# Patient Record
Sex: Male | Born: 1970 | Race: Black or African American | Hispanic: No | Marital: Married | Smoking: Never smoker
Health system: Southern US, Community
[De-identification: ages and names within clinical notes are randomized; demographics above are authoritative.]

## PROBLEM LIST (undated history)

## (undated) DIAGNOSIS — N400 Enlarged prostate without lower urinary tract symptoms: Secondary | ICD-10-CM

## (undated) DIAGNOSIS — K219 Gastro-esophageal reflux disease without esophagitis: Secondary | ICD-10-CM

## (undated) DIAGNOSIS — G43909 Migraine, unspecified, not intractable, without status migrainosus: Secondary | ICD-10-CM

## (undated) HISTORY — PX: VASECTOMY: SHX75

## (undated) HISTORY — PX: FINGER FRACTURE SURGERY: SHX638

## (undated) HISTORY — PX: OTHER SURGICAL HISTORY: SHX169

---

## 1999-10-16 ENCOUNTER — Emergency Department (HOSPITAL_COMMUNITY): Admission: EM | Admit: 1999-10-16 | Discharge: 1999-10-16 | Payer: Self-pay | Admitting: Emergency Medicine

## 2000-07-01 ENCOUNTER — Emergency Department (HOSPITAL_COMMUNITY): Admission: EM | Admit: 2000-07-01 | Discharge: 2000-07-01 | Payer: Self-pay | Admitting: *Deleted

## 2001-07-18 ENCOUNTER — Encounter: Payer: Self-pay | Admitting: Neurosurgery

## 2001-07-18 ENCOUNTER — Ambulatory Visit (HOSPITAL_COMMUNITY): Admission: RE | Admit: 2001-07-18 | Discharge: 2001-07-18 | Payer: Self-pay | Admitting: Neurosurgery

## 2001-08-03 ENCOUNTER — Encounter: Admission: RE | Admit: 2001-08-03 | Discharge: 2001-08-08 | Payer: Self-pay | Admitting: Anesthesiology

## 2019-04-16 DIAGNOSIS — R319 Hematuria, unspecified: Secondary | ICD-10-CM | POA: Diagnosis not present

## 2019-04-16 DIAGNOSIS — N419 Inflammatory disease of prostate, unspecified: Secondary | ICD-10-CM | POA: Diagnosis not present

## 2019-04-16 DIAGNOSIS — N390 Urinary tract infection, site not specified: Secondary | ICD-10-CM | POA: Diagnosis not present

## 2019-06-15 DIAGNOSIS — M549 Dorsalgia, unspecified: Secondary | ICD-10-CM | POA: Diagnosis not present

## 2019-08-23 DIAGNOSIS — L218 Other seborrheic dermatitis: Secondary | ICD-10-CM | POA: Diagnosis not present

## 2019-08-23 DIAGNOSIS — L209 Atopic dermatitis, unspecified: Secondary | ICD-10-CM | POA: Diagnosis not present

## 2019-08-23 DIAGNOSIS — B360 Pityriasis versicolor: Secondary | ICD-10-CM | POA: Diagnosis not present

## 2019-11-06 DIAGNOSIS — Z20828 Contact with and (suspected) exposure to other viral communicable diseases: Secondary | ICD-10-CM | POA: Diagnosis not present

## 2019-12-13 ENCOUNTER — Encounter: Payer: Self-pay | Admitting: Sports Medicine

## 2019-12-13 ENCOUNTER — Ambulatory Visit (INDEPENDENT_AMBULATORY_CARE_PROVIDER_SITE_OTHER): Payer: 59 | Admitting: Sports Medicine

## 2019-12-13 DIAGNOSIS — Z0000 Encounter for general adult medical examination without abnormal findings: Secondary | ICD-10-CM | POA: Diagnosis not present

## 2019-12-13 DIAGNOSIS — Z8619 Personal history of other infectious and parasitic diseases: Secondary | ICD-10-CM | POA: Insufficient documentation

## 2019-12-13 DIAGNOSIS — R300 Dysuria: Secondary | ICD-10-CM | POA: Insufficient documentation

## 2019-12-13 LAB — POCT URINALYSIS DIP (CLINITEK)
Bilirubin, UA: NEGATIVE
Blood, UA: NEGATIVE
Glucose, UA: NEGATIVE mg/dL
Ketones, POC UA: NEGATIVE mg/dL
Leukocytes, UA: NEGATIVE
Nitrite, UA: NEGATIVE
POC PROTEIN,UA: NEGATIVE
Spec Grav, UA: 1.015 (ref 1.010–1.025)
Urobilinogen, UA: 0.2 E.U./dL
pH, UA: 6.5 (ref 5.0–8.0)

## 2019-12-13 MED ORDER — TADALAFIL 20 MG PO TABS: 20.0000 mg | ORAL_TABLET | Freq: Every day | ORAL | 11 refills | Status: DC

## 2019-12-13 MED ORDER — CIPROFLOXACIN HCL 750 MG PO TABS: 750.0000 mg | ORAL_TABLET | Freq: Two times a day (BID) | ORAL | 0 refills | Status: AC

## 2019-12-13 MED ORDER — TADALAFIL 5 MG PO TABS: 5.0000 mg | ORAL_TABLET | Freq: Every day | ORAL | 11 refills | Status: DC

## 2019-12-13 NOTE — Assessment & Plan Note (Addendum)
Likely due to the above but we are doing a full STD screen.

## 2019-12-13 NOTE — Assessment & Plan Note (Signed)
Adding routine labs 

## 2019-12-13 NOTE — Assessment & Plan Note (Signed)
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 Slight burning, hesitancy. Because of his history of severe prostatitis with urosepsis we are going to go ahead and start Cipro 750 twice a day for 10 days. Adding Cialis 5 mg daily with a good Rx coupon to [employer] to keep it affordable. Routine labs including blood cultures and PSA. Urinalysis was negative today.

## 2019-12-13 NOTE — Progress Notes (Signed)
    Procedures performed today:    Urinalysis was completely negative.  Independent interpretation of tests performed by another provider:   None.  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 Slight burning, hesitancy. Because of his history of severe prostatitis with urosepsis we are going to go ahead and start Cipro 750 twice a day for 10 days. Adding Cialis 5 mg daily with a good Rx coupon to [employer] to keep it affordable. Routine labs including blood cultures and PSA. Urinalysis was negative today.  Dysuria Likely due to the above but we are doing a full STD screen.  Annual physical exam Adding routine labs.    ___________________________________________ Kent Scott. Benjamin Stain, M.D., ABFM., CAQSM. Primary Care and Sports Medicine [facility]  Adjunct Instructor of Family Medicine  [employer]

## 2019-12-14 LAB — C. TRACHOMATIS/N. GONORRHOEAE RNA
C. trachomatis RNA, TMA: NOT DETECTED
N. gonorrhoeae RNA, TMA: NOT DETECTED

## 2019-12-17 DIAGNOSIS — Z20828 Contact with and (suspected) exposure to other viral communicable diseases: Secondary | ICD-10-CM | POA: Diagnosis not present

## 2019-12-20 LAB — TESTOSTERONE, FREE & TOTAL
Free Testosterone: 53.9 pg/mL (ref 35.0–155.0)
Testosterone, Total, LC-MS-MS: 432 ng/dL (ref 250–1100)

## 2019-12-20 LAB — CBC WITH DIFFERENTIAL/PLATELET
Absolute Monocytes: 440 cells/uL (ref 200–950)
Basophils Absolute: 30 cells/uL (ref 0–200)
Basophils Relative: 0.8 %
Eosinophils Absolute: 81 cells/uL (ref 15–500)
Eosinophils Relative: 2.2 %
HCT: 43.9 % (ref 38.5–50.0)
Hemoglobin: 14.4 g/dL (ref 13.2–17.1)
Lymphs Abs: 1939 cells/uL (ref 850–3900)
MCH: 27.4 pg (ref 27.0–33.0)
MCHC: 32.8 g/dL (ref 32.0–36.0)
MCV: 83.6 fL (ref 80.0–100.0)
MPV: 10.6 fL (ref 7.5–12.5)
Monocytes Relative: 11.9 %
Neutro Abs: 1210 cells/uL — ABNORMAL LOW (ref 1500–7800)
Neutrophils Relative %: 32.7 %
Platelets: 232 10*3/uL (ref 140–400)
RBC: 5.25 10*6/uL (ref 4.20–5.80)
RDW: 12.9 % (ref 11.0–15.0)
Total Lymphocyte: 52.4 %
WBC: 3.7 10*3/uL — ABNORMAL LOW (ref 3.8–10.8)

## 2019-12-20 LAB — CULTURE, BLOOD (SINGLE): MICRO NUMBER:: 10004298

## 2019-12-20 LAB — COMPLETE METABOLIC PANEL WITH GFR
AG Ratio: 1.3 (calc) (ref 1.0–2.5)
ALT: 30 U/L (ref 9–46)
AST: 23 U/L (ref 10–40)
Albumin: 4.5 g/dL (ref 3.6–5.1)
Alkaline phosphatase (APISO): 58 U/L (ref 36–130)
BUN: 9 mg/dL (ref 7–25)
CO2: 26 mmol/L (ref 20–32)
Calcium: 9.9 mg/dL (ref 8.6–10.3)
Chloride: 101 mmol/L (ref 98–110)
Creat: 1.19 mg/dL (ref 0.60–1.35)
GFR, Est African American: 84 mL/min/{1.73_m2} (ref >=60)
GFR, Est Non African American: 73 mL/min/{1.73_m2} (ref >=60)
Globulin: 3.4 g/dL (calc) (ref 1.9–3.7)
Glucose, Bld: 93 mg/dL (ref 65–99)
Potassium: 4.1 mmol/L (ref 3.5–5.3)
Sodium: 137 mmol/L (ref 135–146)
Total Bilirubin: 0.5 mg/dL (ref 0.2–1.2)
Total Protein: 7.9 g/dL (ref 6.1–8.1)

## 2019-12-20 LAB — LIPID PANEL W/REFLEX DIRECT LDL
Cholesterol: 185 mg/dL (ref <200)
HDL: 44 mg/dL (ref >=40)
LDL Cholesterol (Calc): 119 mg/dL (calc) — ABNORMAL HIGH
Non-HDL Cholesterol (Calc): 141 mg/dL (calc) — ABNORMAL HIGH (ref <130)
Total CHOL/HDL Ratio: 4.2 (calc) (ref <5.0)
Triglycerides: 108 mg/dL (ref <150)

## 2019-12-20 LAB — HEPATITIS PANEL, ACUTE
Hep A IgM: NONREACTIVE
Hep B C IgM: NONREACTIVE
Hepatitis B Surface Ag: NONREACTIVE
Hepatitis C Ab: NONREACTIVE
SIGNAL TO CUT-OFF: 0.05 (ref <1.00)

## 2019-12-20 LAB — TSH: TSH: 2.66 mIU/L (ref 0.40–4.50)

## 2019-12-20 LAB — HEMOGLOBIN A1C
Hgb A1c MFr Bld: 6 % of total Hgb — ABNORMAL HIGH (ref <5.7)
Mean Plasma Glucose: 126 (calc)
eAG (mmol/L): 7 (calc)

## 2019-12-20 LAB — HIV ANTIBODY (ROUTINE TESTING W REFLEX): HIV 1&2 Ab, 4th Generation: NONREACTIVE

## 2019-12-20 LAB — HSV 2 ANTIBODY, IGG: HSV 2 Glycoprotein G Ab, IgG: 0.94 index — ABNORMAL HIGH

## 2019-12-20 LAB — PSA, TOTAL AND FREE
PSA, % Free: 29 % (calc) (ref >25)
PSA, Free: 0.2 ng/mL
PSA, Total: 0.7 ng/mL (ref <=4.0)

## 2019-12-20 LAB — RPR: RPR Ser Ql: NONREACTIVE

## 2019-12-20 LAB — VITAMIN D 25 HYDROXY (VIT D DEFICIENCY, FRACTURES): Vit D, 25-Hydroxy: 47 ng/mL (ref 30–100)

## 2020-01-10 ENCOUNTER — Other Ambulatory Visit: Payer: Self-pay

## 2020-01-10 ENCOUNTER — Encounter: Payer: Self-pay | Admitting: Sports Medicine

## 2020-01-10 ENCOUNTER — Ambulatory Visit (INDEPENDENT_AMBULATORY_CARE_PROVIDER_SITE_OTHER): Payer: 59 | Admitting: Sports Medicine

## 2020-01-10 DIAGNOSIS — N400 Enlarged prostate without lower urinary tract symptoms: Secondary | ICD-10-CM

## 2020-01-10 DIAGNOSIS — R7303 Prediabetes: Secondary | ICD-10-CM | POA: Insufficient documentation

## 2020-01-10 NOTE — Assessment & Plan Note (Signed)
We talked in detail about his prediabetes, he will work aggressively on weight loss.

## 2020-01-10 NOTE — Progress Notes (Signed)
    Procedures performed today:    None.  Independent interpretation of tests performed by another provider:   None.  Impression and Recommendations:    BPH (benign prostatic hyperplasia) Fantastic response to Cialis, no further episodes of nocturia, erectile function is also adequate.  Prediabetes We talked in detail about his prediabetes, he will work aggressively on weight loss.    ___________________________________________ Ihor Austin. Benjamin Stain, M.D., ABFM., CAQSM. Primary Care and Sports Medicine [facility]  Adjunct Instructor of Family Medicine  [employer]

## 2020-01-10 NOTE — Assessment & Plan Note (Signed)
Fantastic response to Cialis, no further episodes of nocturia, erectile function is also adequate.

## 2020-02-01 ENCOUNTER — Ambulatory Visit (INDEPENDENT_AMBULATORY_CARE_PROVIDER_SITE_OTHER): Payer: 59 | Admitting: Sports Medicine

## 2020-02-01 ENCOUNTER — Encounter: Payer: Self-pay | Admitting: Sports Medicine

## 2020-02-01 ENCOUNTER — Ambulatory Visit (INDEPENDENT_AMBULATORY_CARE_PROVIDER_SITE_OTHER): Payer: 59

## 2020-02-01 ENCOUNTER — Other Ambulatory Visit: Payer: Self-pay

## 2020-02-01 DIAGNOSIS — Z779 Other contact with and (suspected) exposures hazardous to health: Secondary | ICD-10-CM

## 2020-02-01 DIAGNOSIS — R05 Cough: Secondary | ICD-10-CM | POA: Diagnosis not present

## 2020-02-01 DIAGNOSIS — R0602 Shortness of breath: Secondary | ICD-10-CM | POA: Diagnosis not present

## 2020-02-01 MED ORDER — PREDNISONE 50 MG PO TABS: 50.0000 mg | ORAL_TABLET | Freq: Every day | ORAL | 0 refills | Status: DC

## 2020-02-01 MED ORDER — IPRATROPIUM-ALBUTEROL 20-100 MCG/ACT IN AERS: 1.0000 | INHALATION_SPRAY | Freq: Four times a day (QID) | RESPIRATORY_TRACT | 3 refills | Status: DC | PRN

## 2020-02-01 NOTE — Assessment & Plan Note (Signed)
Kent Scott was asleep recently, his wife was cleaning with bleach. He woke up with shortness of breath, wheezing, coughing, nausea and a headache. Symptoms have improved to some degree but he still is having mild shortness of breath. His wife had no symptoms at all. 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 He likely has relatively reactive airways, his exam is benign with the exception of [place] trace expiratory wheeze. I think this is more of an irritant bronchoconstriction rather than a pulmonary edema. Adding 5 days of prednisone, Combivent inhaler, chest x-ray. Return to see me if no better and a week.

## 2020-02-01 NOTE — Progress Notes (Signed)
    Procedures performed today:    None.  Independent interpretation of tests performed by another provider:   None.  Impression and Recommendations:    Exposure to respiratory irritant Kent Scott was asleep recently, his wife was cleaning with bleach. He woke up with shortness of breath, wheezing, coughing, nausea and a headache. Symptoms have improved to some degree but he still is having mild shortness of breath. His wife had no symptoms at all. 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 He likely has relatively reactive airways, his exam is benign with the exception of [place] trace expiratory wheeze. I think this is more of an irritant bronchoconstriction rather than a pulmonary edema. Adding 5 days of prednisone, Combivent inhaler, chest x-ray. Return to see me if no better and a week.    ___________________________________________ Ihor Austin. Benjamin Stain, M.D., ABFM., CAQSM. Primary Care and Sports Medicine [facility]  Adjunct Instructor of Family Medicine  [employer]

## 2020-02-15 ENCOUNTER — Ambulatory Visit: Payer: 59 | Admitting: Sports Medicine

## 2020-02-15 ENCOUNTER — Other Ambulatory Visit: Payer: Self-pay

## 2020-02-15 ENCOUNTER — Ambulatory Visit (INDEPENDENT_AMBULATORY_CARE_PROVIDER_SITE_OTHER): Payer: 59

## 2020-02-15 ENCOUNTER — Encounter: Payer: Self-pay | Admitting: Sports Medicine

## 2020-02-15 DIAGNOSIS — Z779 Other contact with and (suspected) exposures hazardous to health: Secondary | ICD-10-CM | POA: Diagnosis not present

## 2020-02-15 DIAGNOSIS — T594X4A Toxic effect of chlorine gas, undetermined, initial encounter: Secondary | ICD-10-CM | POA: Diagnosis not present

## 2020-02-15 MED ORDER — AZITHROMYCIN 250 MG PO TABS: ORAL_TABLET | ORAL | 0 refills | Status: DC

## 2020-02-15 MED ORDER — PREDNISONE 10 MG (48) PO TBPK: ORAL_TABLET | Freq: Every day | ORAL | 0 refills | Status: DC

## 2020-02-15 NOTE — Progress Notes (Signed)
    Procedures performed today:    None.  Independent interpretation of notes and tests performed by another provider:   None.  Impression and Recommendations:    Exposure to respiratory irritant Kent Scott returns, he is a pleasant 49 year old male, we saw him last time after accidental inhalation of aerosolized bleach, he had bronchitic findings on his chest x-ray. We added some prednisone, he returns today about 75% better and continuing to improve. Lungs are clear, repeating chest x-ray, adding a 12-day prednisone taper and azithromycin, return as needed.    ___________________________________________ Ihor Austin. Benjamin Stain, M.D., ABFM., CAQSM. Primary Care and Sports Medicine [facility]  Adjunct Instructor of Family Medicine  [employer]

## 2020-02-15 NOTE — Assessment & Plan Note (Signed)
Kent Scott returns, he is a pleasant 49 year old male, we saw him last time after accidental inhalation of aerosolized bleach, he had bronchitic findings on his chest x-ray. We added some prednisone, he returns today about 75% better and continuing to improve. Lungs are clear, repeating chest x-ray, adding a 12-day prednisone taper and azithromycin, return as needed.

## 2020-02-23 DIAGNOSIS — E538 Deficiency of other specified B group vitamins: Secondary | ICD-10-CM | POA: Diagnosis not present

## 2020-02-23 DIAGNOSIS — E291 Testicular hypofunction: Secondary | ICD-10-CM | POA: Diagnosis not present

## 2020-02-23 DIAGNOSIS — E559 Vitamin D deficiency, unspecified: Secondary | ICD-10-CM | POA: Diagnosis not present

## 2020-02-23 DIAGNOSIS — E60 Dietary zinc deficiency: Secondary | ICD-10-CM | POA: Diagnosis not present

## 2020-02-23 DIAGNOSIS — Z131 Encounter for screening for diabetes mellitus: Secondary | ICD-10-CM | POA: Diagnosis not present

## 2020-02-23 DIAGNOSIS — R5383 Other fatigue: Secondary | ICD-10-CM | POA: Diagnosis not present

## 2020-02-24 DIAGNOSIS — L218 Other seborrheic dermatitis: Secondary | ICD-10-CM | POA: Diagnosis not present

## 2020-02-24 DIAGNOSIS — B360 Pityriasis versicolor: Secondary | ICD-10-CM | POA: Diagnosis not present

## 2020-02-24 DIAGNOSIS — L209 Atopic dermatitis, unspecified: Secondary | ICD-10-CM | POA: Diagnosis not present

## 2020-03-18 DIAGNOSIS — Z23 Encounter for immunization: Secondary | ICD-10-CM | POA: Diagnosis not present

## 2020-06-09 ENCOUNTER — Ambulatory Visit: Payer: 59 | Admitting: Sports Medicine

## 2020-06-09 ENCOUNTER — Encounter: Payer: Self-pay | Admitting: Sports Medicine

## 2020-06-09 DIAGNOSIS — Z779 Other contact with and (suspected) exposures hazardous to health: Secondary | ICD-10-CM

## 2020-06-09 DIAGNOSIS — R7303 Prediabetes: Secondary | ICD-10-CM

## 2020-06-09 DIAGNOSIS — Z0000 Encounter for general adult medical examination without abnormal findings: Secondary | ICD-10-CM | POA: Diagnosis not present

## 2020-06-09 LAB — POCT GLYCOSYLATED HEMOGLOBIN (HGB A1C): Hemoglobin A1C: 5.6 % (ref 4.0–5.6)

## 2020-06-09 NOTE — Progress Notes (Signed)
    Procedures performed today:    None.  Independent interpretation of notes and tests performed by another provider:   None.  Brief History, Exam, Impression, and Recommendations:    Exposure to respiratory irritant Symptoms are now completely resolved.  Prediabetes Has been doing a low carb diet, hemoglobin A1c has improved considerably down to 5.6%, congratulated.  Annual physical exam Up-to-date on all screening and preventative measures.    ___________________________________________ Ihor Austin. Benjamin Stain, M.D., ABFM., CAQSM. Primary Care and Sports Medicine [facility]  Adjunct Instructor of Family Medicine  [employer]

## 2020-06-09 NOTE — Assessment & Plan Note (Addendum)
Has been doing a low carb diet, hemoglobin A1c has improved considerably down to 5.6%, congratulated.

## 2020-06-09 NOTE — Assessment & Plan Note (Signed)
Up-to-date on all screening and preventative measures.

## 2020-06-09 NOTE — Assessment & Plan Note (Signed)
Symptoms are now completely resolved.

## 2020-06-09 NOTE — Addendum Note (Signed)
Addended by: Donne Anon L on: 06/09/2020 04:00 PM   Modules accepted: Orders

## 2020-07-24 DIAGNOSIS — R7989 Other specified abnormal findings of blood chemistry: Secondary | ICD-10-CM | POA: Diagnosis not present

## 2020-07-24 DIAGNOSIS — N400 Enlarged prostate without lower urinary tract symptoms: Secondary | ICD-10-CM | POA: Diagnosis not present

## 2020-07-24 DIAGNOSIS — K228 Other specified diseases of esophagus: Secondary | ICD-10-CM | POA: Diagnosis not present

## 2020-07-24 DIAGNOSIS — K220 Achalasia of cardia: Secondary | ICD-10-CM | POA: Diagnosis not present

## 2020-07-24 DIAGNOSIS — K429 Umbilical hernia without obstruction or gangrene: Secondary | ICD-10-CM | POA: Diagnosis not present

## 2020-07-24 DIAGNOSIS — N23 Unspecified renal colic: Secondary | ICD-10-CM | POA: Diagnosis not present

## 2020-07-24 DIAGNOSIS — R1030 Lower abdominal pain, unspecified: Secondary | ICD-10-CM | POA: Diagnosis not present

## 2020-08-09 ENCOUNTER — Encounter: Payer: Self-pay | Admitting: Family Medicine

## 2020-08-09 ENCOUNTER — Ambulatory Visit (INDEPENDENT_AMBULATORY_CARE_PROVIDER_SITE_OTHER): Payer: 59 | Admitting: Family Medicine

## 2020-08-09 DIAGNOSIS — H6090 Unspecified otitis externa, unspecified ear: Secondary | ICD-10-CM | POA: Insufficient documentation

## 2020-08-09 DIAGNOSIS — H60501 Unspecified acute noninfective otitis externa, right ear: Secondary | ICD-10-CM

## 2020-08-09 MED ORDER — CIPROFLOXACIN-DEXAMETHASONE 0.3-0.1 % OT SUSP: 4.0000 [drp] | Freq: Two times a day (BID) | OTIC | 0 refills | Status: DC

## 2020-08-09 NOTE — Patient Instructions (Signed)

## 2020-08-09 NOTE — Assessment & Plan Note (Signed)
Start ciprodex to R ear x 10 days Instructed to call if symptoms are not resolving or if he develops new/worsening symptoms.

## 2020-08-09 NOTE — Progress Notes (Signed)
Kent Scott - 49 y.o. male MRN [id number]  Date of birth: 1971-08-21  Subjective Chief Complaint  Patient presents with  . Ear Pain    HPI Kent Scott is a 49 y.o. male here today with complaint of R ear pain. Symptoms started a few days ago.  He denies drainage from the ear, fever, chills, changes to hearing, nasal congestion, popping sensation of ear, jaw pain.  He has not tried anything for management of his symptoms from home.   ROS:  A comprehensive ROS was completed and negative except as noted per HPI  Allergies  Allergen Reactions  . Iodine Nausea And Vomiting  . Shellfish Allergy Nausea And Vomiting  . Pollen Extract     seasonal    History reviewed. No pertinent past medical history.  Past Surgical History:  Procedure Laterality Date  . stomach band    . VASECTOMY      Social History   Socioeconomic History  . Marital status: Single    Spouse name: Not on file  . Number of children: Not on file  . Years of education: Not on file  . Highest education level: Not on file  Occupational History  . Not on file  Tobacco Use  . Smoking status: Never Smoker  . Smokeless tobacco: Never Used  Substance and Sexual Activity  . Alcohol use: Not Currently  . Drug use: Never  . Sexual activity: Not on file  Other Topics Concern  . Not on file  Social History Narrative  . Not on file   Social Determinants of Health   Financial Resource Strain:   . Difficulty of Paying Living Expenses: Not on file  Food Insecurity:   . Worried About Programme researcher, broadcasting/film/video in the Last Year: Not on file  . Ran Out of Food in the Last Year: Not on file  Transportation Needs:   . Lack of Transportation (Medical): Not on file  . Lack of Transportation (Non-Medical): Not on file  Physical Activity:   . Days of Exercise per Week: Not on file  . Minutes of Exercise per Session: Not on file  Stress:   . Feeling of Stress : Not on file  Social Connections:   . Frequency of  Communication with Friends and Family: Not on file  . Frequency of Social Gatherings with Friends and Family: Not on file  . Attends Religious Services: Not on file  . Active Member of Clubs or Organizations: Not on file  . Attends Banker Meetings: Not on file  . Marital Status: Not on file    Family History  Problem Relation Age of Onset  . COPD Father     Health Maintenance  Topic Date Due  . INFLUENZA VACCINE  Never done  . TETANUS/TDAP  04/15/2022  . COVID-19 Vaccine  Completed  . Hepatitis C Screening  Completed  . HIV Screening  Completed     ----------------------------------------------------------------------------------------------------------------------------------------------------------------------------------------------------------------- Physical Exam BP 136/86 (BP Location: Left Arm, Patient Position: Sitting, Cuff Size: Large)   Pulse 61   Wt 227 lb 11.2 oz (103.3 kg)   SpO2 100%   BMI 34.62 kg/m   Physical Exam Constitutional:      Appearance: Normal appearance.  HENT:     Head: Normocephalic and atraumatic.     Ears:     Comments: R EAC with erythema and green/purulent discharge.   L EAC normal.   TM normal bilaterally.  Cardiovascular:     Rate and  Rhythm: Normal rate and regular rhythm.  Pulmonary:     Effort: Pulmonary effort is normal.     Breath sounds: Normal breath sounds.  Neurological:     Mental Status: He is alert.     ------------------------------------------------------------------------------------------------------------------------------------------------------------------------------------------------------------------- Assessment and Plan  Otitis externa Start ciprodex to R ear x 10 days Instructed to call if symptoms are not resolving or if he develops new/worsening symptoms.    Meds ordered this encounter  Medications  . ciprofloxacin-dexamethasone (CIPRODEX) OTIC suspension    Sig: Place 4 drops  into the right ear 2 (two) times daily.    Dispense:  7.5 mL    Refill:  0    No follow-ups on file.    This visit occurred during the SARS-CoV-2 public health emergency.  Safety protocols were in place, including screening questions prior to the visit, additional usage of staff PPE, and extensive cleaning of exam room while observing appropriate contact time as indicated for disinfecting solutions.

## 2020-09-20 ENCOUNTER — Other Ambulatory Visit (HOSPITAL_COMMUNITY): Payer: Self-pay

## 2020-10-12 ENCOUNTER — Other Ambulatory Visit: Payer: Self-pay | Admitting: Nurse Practitioner

## 2020-10-12 ENCOUNTER — Ambulatory Visit (INDEPENDENT_AMBULATORY_CARE_PROVIDER_SITE_OTHER): Payer: 59 | Admitting: Nurse Practitioner

## 2020-10-12 ENCOUNTER — Encounter: Payer: Self-pay | Admitting: Nurse Practitioner

## 2020-10-12 VITALS — BP 127/81 | HR 72 | Temp 97.7°F | Ht 68.0 in | Wt 232.1 lb

## 2020-10-12 DIAGNOSIS — H6690 Otitis media, unspecified, unspecified ear: Secondary | ICD-10-CM | POA: Diagnosis not present

## 2020-10-12 DIAGNOSIS — H60313 Diffuse otitis externa, bilateral: Secondary | ICD-10-CM | POA: Diagnosis not present

## 2020-10-12 MED ORDER — AMOXICILLIN-POT CLAVULANATE 875-125 MG PO TABS: 1.0000 | ORAL_TABLET | Freq: Two times a day (BID) | ORAL | 0 refills | Status: DC

## 2020-10-12 MED ORDER — CIPROFLOXACIN-DEXAMETHASONE 0.3-0.1 % OT SUSP: 4.0000 [drp] | Freq: Two times a day (BID) | OTIC | 0 refills | Status: DC

## 2020-10-12 NOTE — Assessment & Plan Note (Signed)
Symptoms of presentation consistent with otitis externa. Today there is bilateral involvement noted.   It is unclear at this time if the initial infection did clear up and has returned due to introduction of water in the ears or if the initial infection never completely cleared. Discussed with patient proper use of Ciprodex with 4 drops instilled into both ears twice a day for at least 7 days.  May repeat for an additional 7 days if symptoms persist. Discussed with patient utilizing earplugs or cotton balls to help prevent introduction of water into the ears while he is bathing. Due to the extent of the infection and noticeable bulging and purulent appearance of drainage behind the ears bilaterally we will also treat with oral antibiotics today to help completely rid of infection. Discussed with patient the importance of following up if symptoms are not improved once antibiotic treatment is completed.

## 2020-10-12 NOTE — Patient Instructions (Signed)
I have called in an antibiotic called Augmentin. You will take this twice a day for 5 days. Take with food to help prevent stomach upset.   You may also use the Ciprodex that you have at home.  Place 4 drops in each ear twice a day for 7 days- if you are still having symptoms, you may continue this for another 7 days (for a total of 14 days)   To use the ear drops, tilt your head to the opposite shoulder of the ear you are putting the drops in.  Pull the top of the ear up and out and place 4 drops into the ear.  Lie on your side or keep your head tilted for 3-5 minutes or place a cotton ball at the outter part of the ear to hold the medicine in place. Keep the cotton ball in place for at least 20 minutes.  Repeat on the opposite side, but wait until the medication has had full effect in the opposite ear before moving on to the second.    Otitis Externa  Otitis externa is an infection of the outer ear canal. The outer ear canal is the area between the outside of the ear and the eardrum. Otitis externa is sometimes called swimmer's ear. What are the causes? Common causes of this condition include:  Swimming in dirty water.  Moisture in the ear.  An injury to the inside of the ear.  An object stuck in the ear.  A cut or scrape on the outside of the ear. What increases the risk? You are more likely to get this condition if you go swimming often. What are the signs or symptoms?  Itching in the ear. This is often the first symptom.  Swelling of the ear.  Redness in the ear.  Ear pain. The pain may get worse when you pull on your ear.  Pus coming from the ear. How is this treated? This condition may be treated with:  Antibiotic ear drops. These are often given for 10-14 days.  Medicines to reduce itching and swelling. Follow these instructions at home:  If you were given antibiotic ear drops, use them as told by your doctor. Do not stop using them even if your condition gets  better.  Take over-the-counter and prescription medicines only as told by your doctor.  Avoid getting water in your ears as told by your doctor. You may be told to avoid swimming or water sports for a few days.  Keep all follow-up visits as told by your doctor. This is important. How is this prevented?  Keep your ears dry. Use the corner of a towel to dry your ears after you swim or bathe.  Try not to scratch or put things in your ear. Doing these things makes it easier for germs to grow in your ear.  Avoid swimming in lakes, dirty water, or pools that may not have the right amount of a chemical called chlorine. Contact a doctor if:  You have a fever.  Your ear is still red, swollen, or painful after 3 days.  You still have pus coming from your ear after 3 days.  Your redness, swelling, or pain gets worse.  You have a really bad headache.  You have redness, swelling, pain, or tenderness behind your ear. Summary  Otitis externa is an infection of the outer ear canal.  Symptoms include pain, redness, and swelling of the ear.  If you were given antibiotic ear drops, use them  as told by your doctor. Do not stop using them even if your condition gets better.  Try not to scratch or put things in your ear. This information is not intended to replace advice given to you by your health care provider. Make sure you discuss any questions you have with your health care provider. Document Revised: 05/01/2018 Document Reviewed: 05/01/2018 Elsevier Patient Education  2020 [employer].

## 2020-10-12 NOTE — Assessment & Plan Note (Signed)
Acute otitis media bilaterally in the setting of bilateral otitis externa. We will treat with oral Augmentin for 5 days. Instructed the patient if symptoms worsen or fail to improve to contact the office for further evaluation.

## 2020-10-12 NOTE — Progress Notes (Signed)
Acute Office Visit  Subjective:    Patient ID: Kent Scott, male    DOB: 05/01/1971, 49 y.o.   MRN: [id number]  Chief Complaint  Patient presents with  . Ear Pain    HPI Kent Scott is a very pleasant 49 year old male presenting today for right-sided ear pain.  He was seen approximately 2 months ago and diagnosed with otitis externa of the right ear.  He was prescribed Ciprodex drops to be utilized in the ear.  He reports that he use the medication consistently for approximately 2 weeks and then discontinued.  He reports that his symptoms were improved when he initially stopped the medication.  He states that over time he has began to develop the right-sided ear pain once again and wants to know if it is okay to restart these eardrops.  He denies fever, chills, nausea, vomiting, sinus pain and pressure, cough, rhinorrhea, sore throat, headache.  He endorses right-sided ear pain with some notable drainage.  He does report that he tends to get water in his ears when he showers.   History reviewed. No pertinent past medical history.  Past Surgical History:  Procedure Laterality Date  . stomach band    . VASECTOMY      Family History  Problem Relation Age of Onset  . COPD Father     Social History   Socioeconomic History  . Marital status: Single    Spouse name: Not on file  . Number of children: Not on file  . Years of education: Not on file  . Highest education level: Not on file  Occupational History  . Not on file  Tobacco Use  . Smoking status: Never Smoker  . Smokeless tobacco: Never Used  Substance and Sexual Activity  . Alcohol use: Not Currently  . Drug use: Never  . Sexual activity: Not on file  Other Topics Concern  . Not on file  Social History Narrative  . Not on file   Social Determinants of Health   Financial Resource Strain:   . Difficulty of Paying Living Expenses: Not on file  Food Insecurity:   . Worried About Programme researcher, broadcasting/film/video in  the Last Year: Not on file  . Ran Out of Food in the Last Year: Not on file  Transportation Needs:   . Lack of Transportation (Medical): Not on file  . Lack of Transportation (Non-Medical): Not on file  Physical Activity:   . Days of Exercise per Week: Not on file  . Minutes of Exercise per Session: Not on file  Stress:   . Feeling of Stress : Not on file  Social Connections:   . Frequency of Communication with Friends and Family: Not on file  . Frequency of Social Gatherings with Friends and Family: Not on file  . Attends Religious Services: Not on file  . Active Member of Clubs or Organizations: Not on file  . Attends Banker Meetings: Not on file  . Marital Status: Not on file  Intimate Partner Violence:   . Fear of Current or Ex-Partner: Not on file  . Emotionally Abused: Not on file  . Physically Abused: Not on file  . Sexually Abused: Not on file    Outpatient Medications Prior to Visit  Medication Sig Dispense Refill  . Blood Pressure Monitoring (OMRON 3 SERIES BP MONITOR) DEVI USE AS DIRECTED    . Hormone Cream Base (HRT BASE) CREA     . tadalafil (CIALIS) 5 MG tablet Take  1 tablet (5 mg total) by mouth daily. 30 tablet 11  . ciprofloxacin-dexamethasone (CIPRODEX) OTIC suspension Place 4 drops into the right ear 2 (two) times daily. (Patient not taking: Reported on 10/12/2020) 7.5 mL 0   No facility-administered medications prior to visit.    Allergies  Allergen Reactions  . Iodine Nausea And Vomiting  . Shellfish Allergy Nausea And Vomiting  . Pollen Extract     seasonal    Review of Systems All review of systems negative except what is listed in the HPI     Objective:    Physical Exam Vitals and nursing note reviewed.  Constitutional:      Appearance: Normal appearance. He is normal weight.  HENT:     Head: Normocephalic.     Jaw: No trismus, tenderness or pain on movement.     Right Ear: Hearing normal. Drainage, swelling and tenderness  present. A middle ear effusion is present. Tympanic membrane is injected and bulging.     Left Ear: Hearing normal. Drainage and swelling present. No tenderness. A middle ear effusion is present. Tympanic membrane is bulging.  Eyes:     Extraocular Movements: Extraocular movements intact.     Conjunctiva/sclera: Conjunctivae normal.     Pupils: Pupils are equal, round, and reactive to light.  Cardiovascular:     Rate and Rhythm: Normal rate and regular rhythm.     Pulses: Normal pulses.  Pulmonary:     Effort: Pulmonary effort is normal.  Musculoskeletal:        General: Normal range of motion.     Cervical back: Normal range of motion. No tenderness.  Lymphadenopathy:     Cervical: Cervical adenopathy present.  Skin:    General: Skin is warm and dry.     Capillary Refill: Capillary refill takes less than 2 seconds.  Neurological:     General: No focal deficit present.     Mental Status: He is alert and oriented to person, place, and time.  Psychiatric:        Mood and Affect: Mood normal.        Behavior: Behavior normal.        Thought Content: Thought content normal.        Judgment: Judgment normal.     BP 127/81   Pulse 72   Temp 97.7 F (36.5 C) (Oral)   Ht 5\' 8"  (1.727 m)   Wt 232 lb 1.6 oz (105.3 kg)   SpO2 98%   BMI 35.29 kg/m  Wt Readings from Last 3 Encounters:  10/12/20 232 lb 1.6 oz (105.3 kg)  08/09/20 227 lb 11.2 oz (103.3 kg)  06/09/20 227 lb (103 kg)    There are no preventive care reminders to display for this patient.  There are no preventive care reminders to display for this patient.   Lab Results  Component Value Date   TSH 2.66 12/13/2019   Lab Results  Component Value Date   WBC 3.7 (L) 12/13/2019   HGB 14.4 12/13/2019   HCT 43.9 12/13/2019   MCV 83.6 12/13/2019   PLT 232 12/13/2019   Lab Results  Component Value Date   NA 137 12/13/2019   K 4.1 12/13/2019   CO2 26 12/13/2019   GLUCOSE 93 12/13/2019   BUN 9 12/13/2019    CREATININE 1.19 12/13/2019   BILITOT 0.5 12/13/2019   AST 23 12/13/2019   ALT 30 12/13/2019   PROT 7.9 12/13/2019   CALCIUM 9.9 12/13/2019   Lab Results  Component Value Date   CHOL 185 12/13/2019   Lab Results  Component Value Date   HDL 44 12/13/2019   Lab Results  Component Value Date   LDLCALC 119 (H) 12/13/2019   Lab Results  Component Value Date   TRIG 108 12/13/2019   Lab Results  Component Value Date   CHOLHDL 4.2 12/13/2019   Lab Results  Component Value Date   HGBA1C 5.6 06/09/2020       Assessment & Plan:   Problem List Items Addressed This Visit      Nervous and Auditory   Otitis externa - Primary    Symptoms of presentation consistent with otitis externa. Today there is bilateral involvement noted.   It is unclear at this time if the initial infection did clear up and has returned due to introduction of water in the ears or if the initial infection never completely cleared. Discussed with patient proper use of Ciprodex with 4 drops instilled into both ears twice a day for at least 7 days.  May repeat for an additional 7 days if symptoms persist. Discussed with patient utilizing earplugs or cotton balls to help prevent introduction of water into the ears while he is bathing. Due to the extent of the infection and noticeable bulging and purulent appearance of drainage behind the ears bilaterally we will also treat with oral antibiotics today to help completely rid of infection. Discussed with patient the importance of following up if symptoms are not improved once antibiotic treatment is completed.      Relevant Medications   amoxicillin-clavulanate (AUGMENTIN) 875-125 MG tablet   ciprofloxacin-dexamethasone (CIPRODEX) OTIC suspension   Acute otitis media    Acute otitis media bilaterally in the setting of bilateral otitis externa. We will treat with oral Augmentin for 5 days. Instructed the patient if symptoms worsen or fail to improve to contact the  office for further evaluation.      Relevant Medications   amoxicillin-clavulanate (AUGMENTIN) 875-125 MG tablet   ciprofloxacin-dexamethasone (CIPRODEX) OTIC suspension       Meds ordered this encounter  Medications  . amoxicillin-clavulanate (AUGMENTIN) 875-125 MG tablet    Sig: Take 1 tablet by mouth 2 (two) times daily.    Dispense:  10 tablet    Refill:  0  . ciprofloxacin-dexamethasone (CIPRODEX) OTIC suspension    Sig: Place 4 drops into the right ear 2 (two) times daily.    Dispense:  7.5 mL    Refill:  0     Tollie Eth, NP

## 2020-10-31 ENCOUNTER — Telehealth (INDEPENDENT_AMBULATORY_CARE_PROVIDER_SITE_OTHER): Payer: 59 | Admitting: Sports Medicine

## 2020-10-31 DIAGNOSIS — E291 Testicular hypofunction: Secondary | ICD-10-CM

## 2020-10-31 MED ORDER — TESTOSTERONE CYPIONATE 200 MG/ML IM SOLN: 200.0000 mg | INTRAMUSCULAR | 3 refills | Status: DC

## 2020-10-31 NOTE — Assessment & Plan Note (Addendum)
Kent Scott has been getting a compounded topical testosterone prescribed by [facility], when we last checked his testosterone levels they were okay at 400 but he tells me when he does the compounded cream he does feel significantly better in terms of energy, erectile function is good after the start of Cialis at the previous visit. He would like me to take over prescribing of this, I did remind him that it would not be covered by insurance unless we had to testosterone levels in the chart in the low range. He is however agreeable to purchase injectable testosterone cypionate and do it this way. Starting 200 mg intramuscular every 2 weeks, good Rx coupon attached, he will return here in a nurse visit to learn how to do the injection, and then 1 week after his third injection we can recheck his testosterone, CBC, PSA. Risks understood. I'd like to see him back in 6 to 8 weeks.

## 2020-10-31 NOTE — Progress Notes (Signed)
   Virtual Visit via WebEx/MyChart   I connected with  Marcellina Millin  on 10/31/20 via WebEx/MyChart/Doximity Video and verified that I am speaking with the correct person using two identifiers.   I discussed the limitations, risks, security and privacy concerns of performing an evaluation and management service by WebEx/MyChart/Doximity Video, including the higher likelihood of inaccurate diagnosis and treatment, and the availability of in person appointments.  We also discussed the likely need of an additional face to face encounter for complete and high quality delivery of care.  I also discussed with the patient that there may be a patient responsible charge related to this service. The patient expressed understanding and wishes to proceed.  Provider location is in medical facility. Patient location is at their home, different from provider location. People involved in care of the patient during this telehealth encounter were myself, my nurse/medical assistant, and my front office/scheduling team member.  Review of Systems: No fevers, chills, night sweats, weight loss, chest pain, or shortness of breath.   Objective Findings:    General: Speaking full sentences, no audible heavy breathing.  Sounds alert and appropriately interactive.  Appears well.  Face symmetric.  Extraocular movements intact.  Pupils equal and round.  No nasal flaring or accessory muscle use visualized.  Independent interpretation of tests performed by another provider:   None.  Brief History, Exam, Impression, and Recommendations:    Male hypogonadism Amond has been getting a compounded topical testosterone prescribed by [facility], when we last checked his testosterone levels they were okay at 400 but he tells me when he does the compounded cream he does feel significantly better in terms of energy, erectile function is good after the start of Cialis at the previous visit. He would like me to  take over prescribing of this, I did remind him that it would not be covered by insurance unless we had to testosterone levels in the chart in the low range. He is however agreeable to purchase injectable testosterone cypionate and do it this way. Starting 200 mg intramuscular every 2 weeks, good Rx coupon attached, he will return here in a nurse visit to learn how to do the injection, and then 1 week after his third injection we can recheck his testosterone, CBC, PSA. Risks understood. I'd like to see him back in 6 to 8 weeks.   I discussed the above assessment and treatment plan with the patient. The patient was provided an opportunity to ask questions and all were answered. The patient agreed with the plan and demonstrated an understanding of the instructions.   The patient was advised to call back or seek an in-person evaluation if the symptoms worsen or if the condition fails to improve as anticipated.   I provided 30 minutes of face to face and non-face-to-face time during this encounter date, time was needed to gather information, review chart, records, communicate/coordinate with staff remotely, as well as complete documentation.   ___________________________________________ Ihor Austin. Benjamin Stain, M.D., ABFM., CAQSM. Primary Care and Sports Medicine [facility]  Adjunct Instructor of Family Medicine  [employer]

## 2020-11-08 ENCOUNTER — Telehealth: Payer: Self-pay | Admitting: Sports Medicine

## 2020-11-08 ENCOUNTER — Ambulatory Visit: Payer: 59

## 2020-11-08 NOTE — Telephone Encounter (Signed)
Also to add, I tried to change this to the correct pharmacy and it appeared it changed it. AM

## 2020-11-08 NOTE — Telephone Encounter (Signed)
Patient said his preferred pharmacy was in the system wrong. The correct pharmacy is:   [facility]  Address: [address] [place], [place], [place] [address]  Phone: [phone]

## 2020-11-10 ENCOUNTER — Ambulatory Visit: Payer: 59

## 2020-11-13 ENCOUNTER — Ambulatory Visit (INDEPENDENT_AMBULATORY_CARE_PROVIDER_SITE_OTHER): Payer: 59 | Admitting: Sports Medicine

## 2020-11-13 DIAGNOSIS — E291 Testicular hypofunction: Secondary | ICD-10-CM | POA: Diagnosis not present

## 2020-11-13 NOTE — Progress Notes (Signed)
Established Patient Office Visit  Subjective:  Patient ID: Kent Scott, male    DOB: [birth year]  Age: 49 y.o. MRN: [id number]  CC:  Chief Complaint  Patient presents with  . Patient Education    HPI Kent Scott presents for education of testosterone injections.   History reviewed. No pertinent past medical history.  Past Surgical History:  Procedure Laterality Date  . stomach band    . VASECTOMY      Family History  Problem Relation Age of Onset  . COPD Father     Social History   Socioeconomic History  . Marital status: Single    Spouse name: Not on file  . Number of children: Not on file  . Years of education: Not on file  . Highest education level: Not on file  Occupational History  . Not on file  Tobacco Use  . Smoking status: Never Smoker  . Smokeless tobacco: Never Used  Substance and Sexual Activity  . Alcohol use: Not Currently  . Drug use: Never  . Sexual activity: Not on file  Other Topics Concern  . Not on file  Social History Narrative  . Not on file   Social Determinants of Health   Financial Resource Strain:   . Difficulty of Paying Living Expenses: Not on file  Food Insecurity:   . Worried About Programme researcher, broadcasting/film/video in the Last Year: Not on file  . Ran Out of Food in the Last Year: Not on file  Transportation Needs:   . Lack of Transportation (Medical): Not on file  . Lack of Transportation (Non-Medical): Not on file  Physical Activity:   . Days of Exercise per Week: Not on file  . Minutes of Exercise per Session: Not on file  Stress:   . Feeling of Stress : Not on file  Social Connections:   . Frequency of Communication with Friends and Family: Not on file  . Frequency of Social Gatherings with Friends and Family: Not on file  . Attends Religious Services: Not on file  . Active Member of Clubs or Organizations: Not on file  . Attends Banker Meetings: Not on file  . Marital Status: Not on file  Intimate  Partner Violence:   . Fear of Current or Ex-Partner: Not on file  . Emotionally Abused: Not on file  . Physically Abused: Not on file  . Sexually Abused: Not on file    Outpatient Medications Prior to Visit  Medication Sig Dispense Refill  . Blood Pressure Monitoring (OMRON 3 SERIES BP MONITOR) DEVI USE AS DIRECTED    . Hormone Cream Base (HRT BASE) CREA     . tadalafil (CIALIS) 5 MG tablet Take 1 tablet (5 mg total) by mouth daily. 30 tablet 11  . testosterone cypionate (DEPOTESTOSTERONE CYPIONATE) 200 MG/ML injection Inject 1 mL (200 mg total) into the muscle every 14 (fourteen) days. 2 mL 3   No facility-administered medications prior to visit.    Allergies  Allergen Reactions  . Iodine Nausea And Vomiting  . Shellfish Allergy Nausea And Vomiting  . Pollen Extract     seasonal    ROS Review of Systems    Objective:    Physical Exam  There were no vitals taken for this visit. Wt Readings from Last 3 Encounters:  10/12/20 232 lb 1.6 oz (105.3 kg)  08/09/20 227 lb 11.2 oz (103.3 kg)  06/09/20 227 lb (103 kg)     There are no  preventive care reminders to display for this patient.  There are no preventive care reminders to display for this patient.  Lab Results  Component Value Date   TSH 2.66 12/13/2019   Lab Results  Component Value Date   WBC 3.7 (L) 12/13/2019   HGB 14.4 12/13/2019   HCT 43.9 12/13/2019   MCV 83.6 12/13/2019   PLT 232 12/13/2019   Lab Results  Component Value Date   NA 137 12/13/2019   K 4.1 12/13/2019   CO2 26 12/13/2019   GLUCOSE 93 12/13/2019   BUN 9 12/13/2019   CREATININE 1.19 12/13/2019   BILITOT 0.5 12/13/2019   AST 23 12/13/2019   ALT 30 12/13/2019   PROT 7.9 12/13/2019   CALCIUM 9.9 12/13/2019   Lab Results  Component Value Date   CHOL 185 12/13/2019   Lab Results  Component Value Date   HDL 44 12/13/2019   Lab Results  Component Value Date   LDLCALC 119 (H) 12/13/2019   Lab Results  Component Value Date    TRIG 108 12/13/2019   Lab Results  Component Value Date   CHOLHDL 4.2 12/13/2019   Lab Results  Component Value Date   HGBA1C 5.6 06/09/2020      Assessment & Plan:  Education on Testosterone injection - Patient was able to give himself injection with instruction without complications.    Problem List Items Addressed This Visit    Male hypogonadism - Primary      No orders of the defined types were placed in this encounter.   Follow-up: No follow-ups on file.    Kent Scott, CMA

## 2021-01-04 ENCOUNTER — Ambulatory Visit (INDEPENDENT_AMBULATORY_CARE_PROVIDER_SITE_OTHER): Payer: 59 | Admitting: Sports Medicine

## 2021-01-04 ENCOUNTER — Encounter: Payer: Self-pay | Admitting: Sports Medicine

## 2021-01-04 ENCOUNTER — Ambulatory Visit (INDEPENDENT_AMBULATORY_CARE_PROVIDER_SITE_OTHER): Payer: 59

## 2021-01-04 ENCOUNTER — Other Ambulatory Visit: Payer: Self-pay | Admitting: Sports Medicine

## 2021-01-04 ENCOUNTER — Other Ambulatory Visit: Payer: Self-pay

## 2021-01-04 DIAGNOSIS — E291 Testicular hypofunction: Secondary | ICD-10-CM | POA: Diagnosis not present

## 2021-01-04 DIAGNOSIS — R0789 Other chest pain: Secondary | ICD-10-CM | POA: Diagnosis not present

## 2021-01-04 DIAGNOSIS — E782 Mixed hyperlipidemia: Secondary | ICD-10-CM | POA: Diagnosis not present

## 2021-01-04 DIAGNOSIS — R079 Chest pain, unspecified: Secondary | ICD-10-CM | POA: Diagnosis not present

## 2021-01-04 DIAGNOSIS — Z8619 Personal history of other infectious and parasitic diseases: Secondary | ICD-10-CM

## 2021-01-04 MED ORDER — TESTOSTERONE CYPIONATE 200 MG/ML IM SOLN: 200.0000 mg | INTRAMUSCULAR | 3 refills | Status: DC

## 2021-01-04 NOTE — Progress Notes (Addendum)
    Procedures performed today:    None.  Independent interpretation of notes and tests performed by another provider:   None.  Brief History, Exam, Impression, and Recommendations:    Male hypogonadism Refilling testosterone. He will get his levels checked proximally 1 week after his third dose.  Atypical chest pain Nino had an episode during working out where he felt some chest tightness and presyncope. It sounds as though he did not have an adequate warm up. Typically he does not get any chest discomfort when working out. Due to his age we are going to evaluate him from a cardiovascular standpoint with CBC, CMP, D-dimer, BNP. Exercise treadmill test, chest x-ray. Return to see me to go over results.  Mixed hyperlipidemia Crestor sent in, recheck lipids in 2 months fasting.    ___________________________________________ Ihor Austin. Benjamin Stain, M.D., ABFM., CAQSM. Primary Care and Sports Medicine [facility]  Adjunct Instructor of Family Medicine  [employer]

## 2021-01-04 NOTE — Assessment & Plan Note (Signed)
Refilling testosterone. He will get his levels checked proximally 1 week after his third dose.

## 2021-01-04 NOTE — Assessment & Plan Note (Signed)
Kent Scott had an episode during working out where he felt some chest tightness and presyncope. It sounds as though he did not have an adequate warm up. Typically he does not get any chest discomfort when working out. Due to his age we are going to evaluate him from a cardiovascular standpoint with CBC, CMP, D-dimer, BNP. Exercise treadmill test, chest x-ray. Return to see me to go over results.

## 2021-01-04 NOTE — Addendum Note (Signed)
Addended by: Monica Becton on: 01/04/2021 09:31 AM   Modules accepted: Orders

## 2021-01-05 LAB — COMPREHENSIVE METABOLIC PANEL
AG Ratio: 1.7 (calc) (ref 1.0–2.5)
ALT: 25 U/L (ref 9–46)
AST: 18 U/L (ref 10–40)
Albumin: 4.5 g/dL (ref 3.6–5.1)
Alkaline phosphatase (APISO): 50 U/L (ref 36–130)
BUN: 11 mg/dL (ref 7–25)
CO2: 27 mmol/L (ref 20–32)
Calcium: 9.5 mg/dL (ref 8.6–10.3)
Chloride: 103 mmol/L (ref 98–110)
Creat: 1.12 mg/dL (ref 0.60–1.35)
Globulin: 2.7 g/dL (calc) (ref 1.9–3.7)
Glucose, Bld: 85 mg/dL (ref 65–99)
Potassium: 4.2 mmol/L (ref 3.5–5.3)
Sodium: 138 mmol/L (ref 135–146)
Total Bilirubin: 0.7 mg/dL (ref 0.2–1.2)
Total Protein: 7.2 g/dL (ref 6.1–8.1)

## 2021-01-05 LAB — LIPID PANEL
Cholesterol: 211 mg/dL — ABNORMAL HIGH (ref <200)
HDL: 45 mg/dL (ref >=40)
LDL Cholesterol (Calc): 145 mg/dL (calc) — ABNORMAL HIGH
Non-HDL Cholesterol (Calc): 166 mg/dL (calc) — ABNORMAL HIGH (ref <130)
Total CHOL/HDL Ratio: 4.7 (calc) (ref <5.0)
Triglycerides: 97 mg/dL (ref <150)

## 2021-01-05 LAB — CBC
HCT: 44.5 % (ref 38.5–50.0)
Hemoglobin: 14.7 g/dL (ref 13.2–17.1)
MCH: 28.4 pg (ref 27.0–33.0)
MCHC: 33 g/dL (ref 32.0–36.0)
MCV: 86.1 fL (ref 80.0–100.0)
MPV: 10.8 fL (ref 7.5–12.5)
Platelets: 265 10*3/uL (ref 140–400)
RBC: 5.17 10*6/uL (ref 4.20–5.80)
RDW: 13.1 % (ref 11.0–15.0)
WBC: 6.6 10*3/uL (ref 3.8–10.8)

## 2021-01-05 LAB — BRAIN NATRIURETIC PEPTIDE: Brain Natriuretic Peptide: <4 pg/mL (ref <100)

## 2021-01-05 LAB — D-DIMER, QUANTITATIVE: D-Dimer, Quant: 0.23 mcg/mL FEU (ref <0.50)

## 2021-01-06 ENCOUNTER — Other Ambulatory Visit: Payer: Self-pay | Admitting: Sports Medicine

## 2021-01-06 DIAGNOSIS — E782 Mixed hyperlipidemia: Secondary | ICD-10-CM | POA: Insufficient documentation

## 2021-01-06 MED ORDER — ROSUVASTATIN CALCIUM 20 MG PO TABS: 20.0000 mg | ORAL_TABLET | Freq: Every day | ORAL | 3 refills | Status: DC

## 2021-01-06 NOTE — Assessment & Plan Note (Signed)
Crestor sent in, recheck lipids in 2 months fasting.

## 2021-01-06 NOTE — Addendum Note (Signed)
Addended by: Monica Becton on: 01/06/2021 08:43 PM   Modules accepted: Orders

## 2021-01-12 DIAGNOSIS — H52221 Regular astigmatism, right eye: Secondary | ICD-10-CM | POA: Diagnosis not present

## 2021-01-12 DIAGNOSIS — H524 Presbyopia: Secondary | ICD-10-CM | POA: Diagnosis not present

## 2021-01-12 DIAGNOSIS — H5213 Myopia, bilateral: Secondary | ICD-10-CM | POA: Diagnosis not present
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
## 2021-02-24 ENCOUNTER — Inpatient Hospital Stay (HOSPITAL_COMMUNITY): Admission: RE | Admit: 2021-02-24 | Payer: 59 | Source: Ambulatory Visit

## 2021-02-27 ENCOUNTER — Other Ambulatory Visit (HOSPITAL_BASED_OUTPATIENT_CLINIC_OR_DEPARTMENT_OTHER): Payer: Self-pay

## 2021-05-08 ENCOUNTER — Ambulatory Visit (INDEPENDENT_AMBULATORY_CARE_PROVIDER_SITE_OTHER): Payer: 59

## 2021-05-08 ENCOUNTER — Other Ambulatory Visit: Payer: Self-pay

## 2021-05-08 DIAGNOSIS — R0789 Other chest pain: Secondary | ICD-10-CM | POA: Diagnosis not present

## 2021-05-08 LAB — EXERCISE TOLERANCE TEST
Estimated workload: 9.6 METS
Exercise duration (min): 7 min
Exercise duration (sec): 44 s
MPHR: 172 {beats}/min
Peak HR: 146 {beats}/min
Percent HR: 85 %
RPE: 17
Rest HR: 81 {beats}/min

## 2021-05-28 ENCOUNTER — Ambulatory Visit: Payer: 59

## 2021-06-08 ENCOUNTER — Ambulatory Visit: Payer: 59 | Admitting: Sports Medicine

## 2021-07-04 ENCOUNTER — Other Ambulatory Visit: Payer: Self-pay

## 2021-07-04 ENCOUNTER — Ambulatory Visit: Payer: 59 | Admitting: Medical-Surgical

## 2021-07-04 ENCOUNTER — Other Ambulatory Visit (HOSPITAL_COMMUNITY): Payer: Self-pay

## 2021-07-04 ENCOUNTER — Encounter: Payer: Self-pay | Admitting: Medical-Surgical

## 2021-07-04 VITALS — BP 124/85 | HR 95 | Temp 100.1°F | Resp 20 | Ht 68.0 in | Wt 229.0 lb

## 2021-07-04 DIAGNOSIS — Z20822 Contact with and (suspected) exposure to covid-19: Secondary | ICD-10-CM | POA: Diagnosis not present

## 2021-07-04 DIAGNOSIS — R0989 Other specified symptoms and signs involving the circulatory and respiratory systems: Secondary | ICD-10-CM | POA: Diagnosis not present

## 2021-07-04 DIAGNOSIS — U071 COVID-19: Secondary | ICD-10-CM | POA: Diagnosis not present

## 2021-07-04 DIAGNOSIS — R059 Cough, unspecified: Secondary | ICD-10-CM | POA: Diagnosis not present

## 2021-07-04 MED ORDER — ALBUTEROL SULFATE HFA 108 (90 BASE) MCG/ACT IN AERS
2.0000 | INHALATION_SPRAY | Freq: Four times a day (QID) | RESPIRATORY_TRACT | 11 refills | Status: DC | PRN
  Filled 2021-07-04: qty 36, 50d supply, fill #0

## 2021-07-04 MED ORDER — IPRATROPIUM BROMIDE 0.03 % NA SOLN
2.0000 | Freq: Two times a day (BID) | NASAL | 0 refills | Status: AC
  Filled 2021-07-04: qty 30, 75d supply, fill #0

## 2021-07-04 MED ORDER — BENZONATATE 200 MG PO CAPS
200.0000 mg | ORAL_CAPSULE | Freq: Three times a day (TID) | ORAL | 0 refills | Status: DC | PRN
  Filled 2021-07-04: qty 45, 15d supply, fill #0
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
## 2021-07-05 ENCOUNTER — Encounter: Payer: Self-pay | Admitting: Medical-Surgical

## 2021-07-05 ENCOUNTER — Other Ambulatory Visit (HOSPITAL_COMMUNITY): Payer: Self-pay

## 2021-07-05 LAB — NOVEL CORONAVIRUS, NAA: SARS-CoV-2, NAA: DETECTED — AB

## 2021-07-05 LAB — SARS-COV-2, NAA 2 DAY TAT

## 2021-07-05 MED ORDER — NIRMATRELVIR/RITONAVIR (PAXLOVID)TABLET
3.0000 | ORAL_TABLET | Freq: Two times a day (BID) | ORAL | 0 refills | Status: AC
Start: 2021-07-05 — End: 2021-07-10
  Filled 2021-07-05: qty 30, 5d supply, fill #0

## 2021-07-05 MED ORDER — HYDROCODONE BIT-HOMATROP MBR 5-1.5 MG/5ML PO SOLN
5.0000 mL | Freq: Three times a day (TID) | ORAL | 0 refills | Status: DC | PRN
  Filled 2021-07-05: qty 60, 4d supply, fill #0

## 2021-07-05 NOTE — Addendum Note (Signed)
Addended byChristen Butter on: 07/05/2021 03:37 PM   Modules accepted: Orders

## 2021-07-10 ENCOUNTER — Ambulatory Visit: Payer: 59 | Admitting: Cardiology

## 2021-11-22 ENCOUNTER — Ambulatory Visit: Payer: 59

## 2021-11-22 ENCOUNTER — Telehealth (INDEPENDENT_AMBULATORY_CARE_PROVIDER_SITE_OTHER): Payer: 59 | Admitting: Sports Medicine

## 2021-11-22 DIAGNOSIS — R55 Syncope and collapse: Secondary | ICD-10-CM | POA: Diagnosis not present

## 2021-11-22 DIAGNOSIS — R42 Dizziness and giddiness: Secondary | ICD-10-CM | POA: Diagnosis not present

## 2021-11-22 NOTE — Progress Notes (Signed)
° °  Virtual Visit via Telephone   I connected with  Kent Scott  on 11/22/21 by telephone/telehealth and verified that I am speaking with the correct person using two identifiers.   I discussed the limitations, risks, security and privacy concerns of performing an evaluation and management service by telephone, including the higher likelihood of inaccurate diagnosis and treatment, and the availability of in person appointments.  We also discussed the likely need of an additional face to face encounter for complete and high quality delivery of care.  I also discussed with the patient that there may be a patient responsible charge related to this service. The patient expressed understanding and wishes to proceed.  Provider location is in medical facility. Patient location is at their home, different from provider location. People involved in care of the patient during this telehealth encounter were myself, my nurse/medical assistant, and my front office/scheduling team member.  Review of Systems: No fevers, chills, night sweats, weight loss, chest pain, or shortness of breath.   Objective Findings:    General: Speaking full sentences, no audible heavy breathing.  Sounds alert and appropriately interactive.    Independent interpretation of tests performed by another provider:   None.  Brief History, Exam, Impression, and Recommendations:    Postural dizziness with presyncope This is a pleasant 50 year old male, history of hyperlipidemia, BPH. We did a stress test in May of this year that was normal. More recently he has been trying to lose weight, doing intermittent fasting, he had a couple episodes of presyncope, not entirely postural, associated with ringing in his ears, sweating, nausea, mild vertigo. No associated chest pain, maybe some palpitations. He has been under a great deal of stress lately. The most recent episode occurred after intermittent fasting, and then trying to eat  a large amount of steak from a [place] [employer]. Symptoms resolved on their own. We are going to check some labs, I would like a Holter monitor, echocardiogram to complete the cardiac work-up. We have also discussed multiple smaller meals rather than a single large meal of meat.   I discussed the above assessment and treatment plan with the patient. The patient was provided an opportunity to ask questions and all were answered. The patient agreed with the plan and demonstrated an understanding of the instructions.   The patient was advised to call back or seek an in-person evaluation if the symptoms worsen or if the condition fails to improve as anticipated.   I provided 30 minutes of verbal and non-verbal time during this encounter date, time was needed to gather information, review chart, records, communicate/coordinate with staff remotely, as well as complete documentation.   ___________________________________________ Ihor Austin. Benjamin Stain, M.D., ABFM., CAQSM. Primary Care and Sports Medicine [facility]  Adjunct Professor of Family Medicine  [employer]

## 2021-11-22 NOTE — Assessment & Plan Note (Signed)
This is a pleasant 50 year old male, history of hyperlipidemia, BPH. We did a stress test in May of this year that was normal. More recently he has been trying to lose weight, doing intermittent fasting, he had a couple episodes of presyncope, not entirely postural, associated with ringing in his ears, sweating, nausea, mild vertigo. No associated chest pain, maybe some palpitations. He has been under a great deal of stress lately. The most recent episode occurred after intermittent fasting, and then trying to eat a large amount of steak from a [place] [employer]. Symptoms resolved on their own. We are going to check some labs, I would like a Holter monitor, echocardiogram to complete the cardiac work-up. We have also discussed multiple smaller meals rather than a single large meal of meat.

## 2021-11-22 NOTE — Progress Notes (Signed)
Patient reports doing an intermediate fasting diet for the past 7 weeks. He has lost 14-15 lbs. He has had to episodes of dizziness where he thought he was going to collapse. He doesn't know if the dies it too restrictive for him or if he has vertigo. Telephone call.

## 2021-11-22 NOTE — Progress Notes (Unsigned)
Enrolled for [employer] to mail a ZIO XT long term holter monitor to the patients address on file.  

## 2021-11-23 DIAGNOSIS — R55 Syncope and collapse: Secondary | ICD-10-CM | POA: Diagnosis not present

## 2021-11-23 DIAGNOSIS — R42 Dizziness and giddiness: Secondary | ICD-10-CM | POA: Diagnosis not present

## 2021-11-24 LAB — COMPREHENSIVE METABOLIC PANEL
AG Ratio: 1.4 (calc) (ref 1.0–2.5)
ALT: 21 U/L (ref 9–46)
AST: 19 U/L (ref 10–40)
Albumin: 4.2 g/dL (ref 3.6–5.1)
Alkaline phosphatase (APISO): 52 U/L (ref 36–130)
BUN: 9 mg/dL (ref 7–25)
CO2: 27 mmol/L (ref 20–32)
Calcium: 9.2 mg/dL (ref 8.6–10.3)
Chloride: 103 mmol/L (ref 98–110)
Creat: 1.11 mg/dL (ref 0.60–1.29)
Globulin: 2.9 g/dL (calc) (ref 1.9–3.7)
Glucose, Bld: 89 mg/dL (ref 65–99)
Potassium: 4.2 mmol/L (ref 3.5–5.3)
Sodium: 138 mmol/L (ref 135–146)
Total Bilirubin: 0.6 mg/dL (ref 0.2–1.2)
Total Protein: 7.1 g/dL (ref 6.1–8.1)

## 2021-11-24 LAB — CBC
HCT: 43.7 % (ref 38.5–50.0)
Hemoglobin: 13.9 g/dL (ref 13.2–17.1)
MCH: 27.4 pg (ref 27.0–33.0)
MCHC: 31.8 g/dL — ABNORMAL LOW (ref 32.0–36.0)
MCV: 86 fL (ref 80.0–100.0)
MPV: 10.7 fL (ref 7.5–12.5)
Platelets: 254 10*3/uL (ref 140–400)
RBC: 5.08 10*6/uL (ref 4.20–5.80)
RDW: 13 % (ref 11.0–15.0)
WBC: 5.7 10*3/uL (ref 3.8–10.8)

## 2021-11-24 LAB — LIPID PANEL
Cholesterol: 187 mg/dL (ref <200)
HDL: 48 mg/dL (ref >=40)
LDL Cholesterol (Calc): 121 mg/dL (calc) — ABNORMAL HIGH
Non-HDL Cholesterol (Calc): 139 mg/dL (calc) — ABNORMAL HIGH (ref <130)
Total CHOL/HDL Ratio: 3.9 (calc) (ref <5.0)
Triglycerides: 83 mg/dL (ref <150)

## 2021-11-24 LAB — TSH: TSH: 1.45 mIU/L (ref 0.40–4.50)

## 2021-11-24 LAB — HEMOGLOBIN A1C
Hgb A1c MFr Bld: 5.8 % of total Hgb — ABNORMAL HIGH (ref <5.7)
Mean Plasma Glucose: 120 mg/dL
eAG (mmol/L): 6.6 mmol/L

## 2021-12-17 IMAGING — DX DG CHEST 2V
2 series · 2 of 2 positions shown · non-contrast
Comparison: Chest radiograph 02/15/2020

CLINICAL DATA: Patient states that he has had some atypical chest
pains when working out, no other complaints is

EXAM:
CHEST - 2 VIEW

[chest pa]
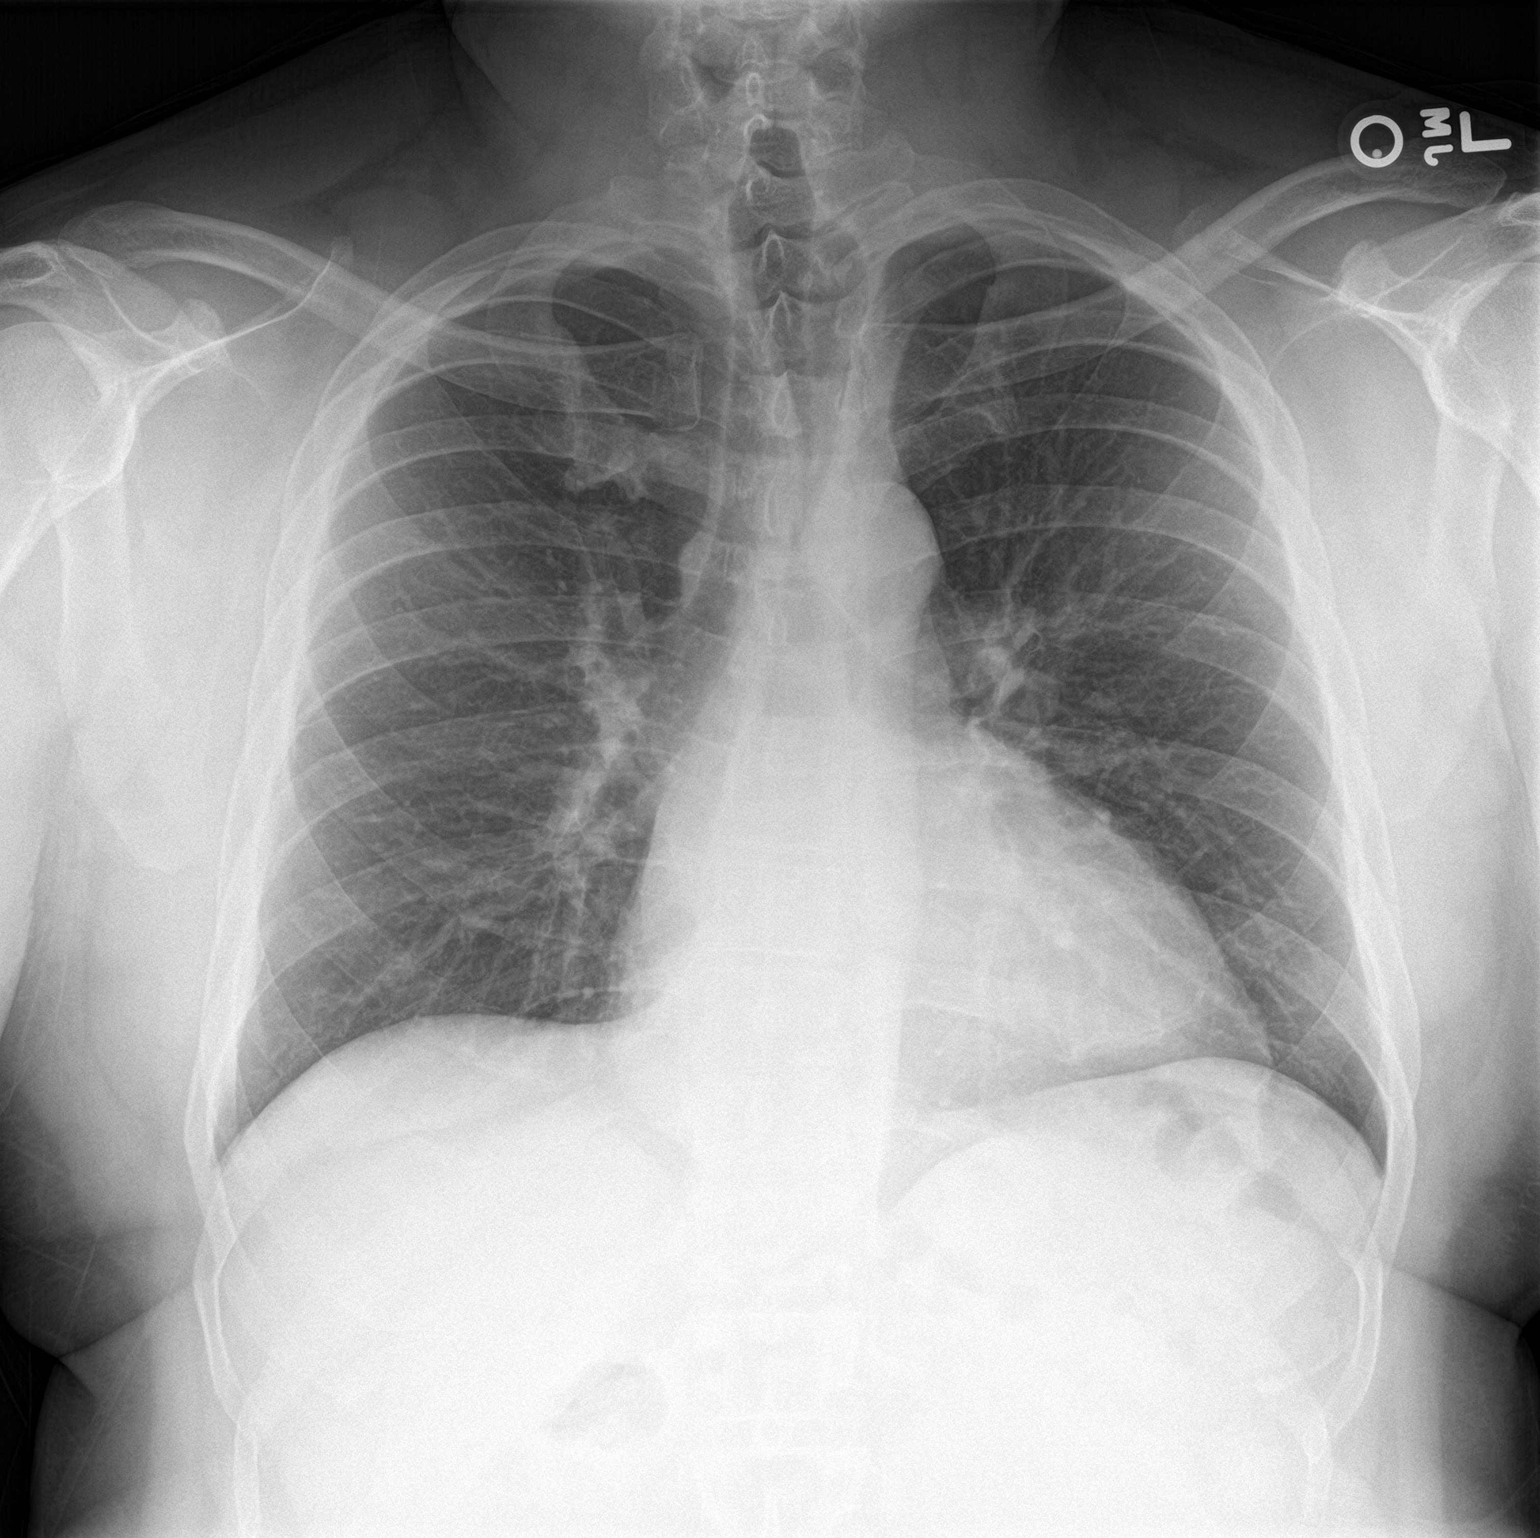

[chest lat]
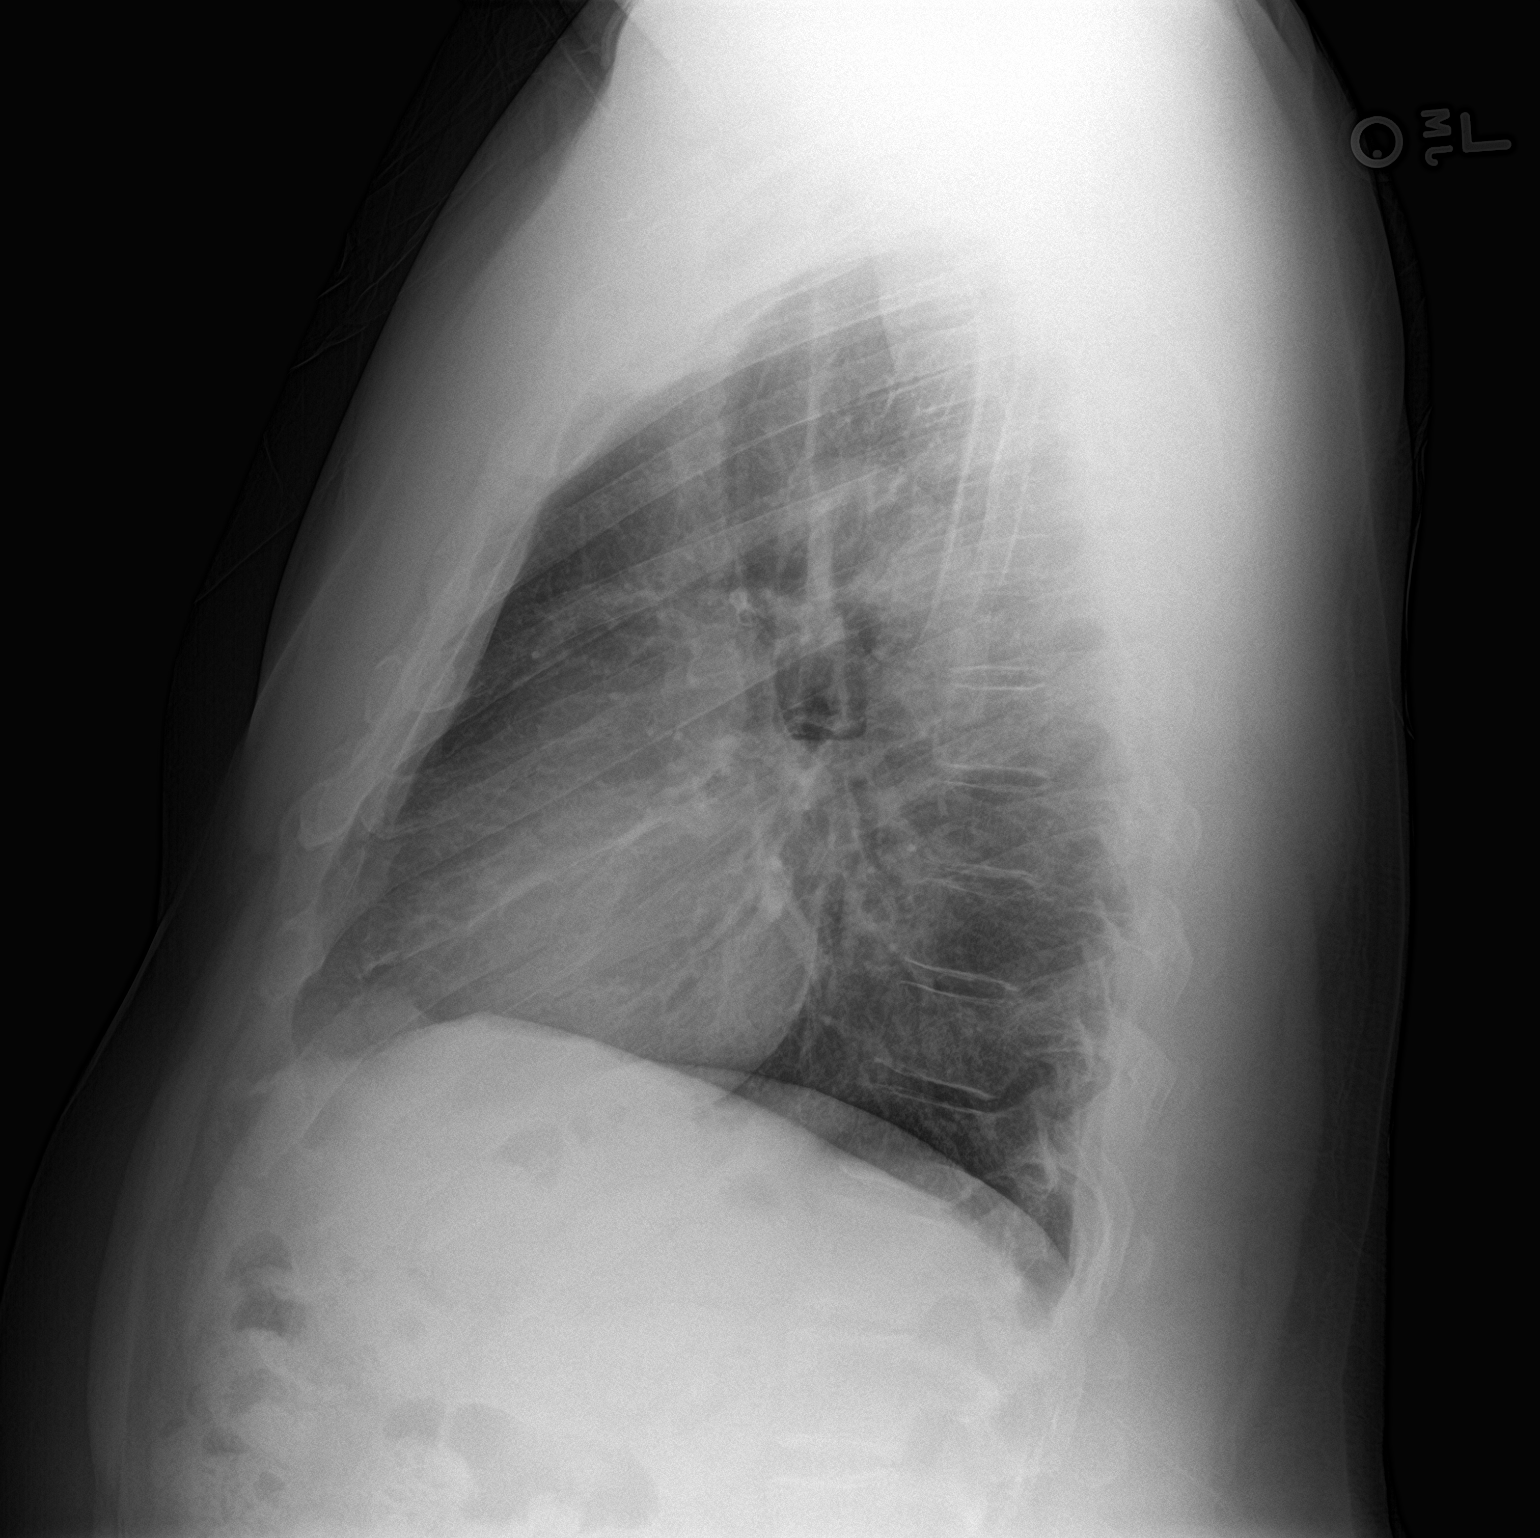

[2 of 2 positions shown; findings below may reference images not displayed]

FINDINGS: Stable cardiomediastinal contours. The lungs are clear. No
pneumothorax or pleural effusion. No acute finding in the visualized
skeleton.
IMPRESSION: No acute cardiopulmonary process.

## 2021-12-27 ENCOUNTER — Ambulatory Visit (HOSPITAL_BASED_OUTPATIENT_CLINIC_OR_DEPARTMENT_OTHER)
Admission: RE | Admit: 2021-12-27 | Discharge: 2021-12-27 | Disposition: A | Discharge status: Home / Self Care | Payer: 59 | Source: Ambulatory Visit | Attending: Sports Medicine | Admitting: Sports Medicine

## 2021-12-27 ENCOUNTER — Other Ambulatory Visit: Payer: Self-pay

## 2021-12-27 DIAGNOSIS — R55 Syncope and collapse: Secondary | ICD-10-CM | POA: Insufficient documentation

## 2021-12-27 DIAGNOSIS — R42 Dizziness and giddiness: Secondary | ICD-10-CM | POA: Insufficient documentation

## 2021-12-27 LAB — ECHOCARDIOGRAM COMPLETE
AR max vel: 2.33 cm2
AV Area VTI: 2.35 cm2
AV Area mean vel: 2.18 cm2
AV Mean grad: 4 mmHg
AV Peak grad: 7.6 mmHg
Ao pk vel: 1.38 m/s
Area-P 1/2: 3.81 cm2
S' Lateral: 3.2 cm

## 2021-12-27 NOTE — Progress Notes (Signed)
°  Echocardiogram 2D Echocardiogram has been performed.  Roosvelt Maser F 12/27/2021, 12:21 PM

## 2022-01-16 ENCOUNTER — Other Ambulatory Visit: Payer: Self-pay | Admitting: Sports Medicine

## 2022-01-16 DIAGNOSIS — Z8619 Personal history of other infectious and parasitic diseases: Secondary | ICD-10-CM

## 2022-01-18 ENCOUNTER — Telehealth: Payer: Self-pay

## 2022-01-18 NOTE — Telephone Encounter (Signed)
-----   Message from [place] sent at 01/17/2022  4:13 PM EST ----- Regarding: RE: Needs appt with T Lvm for patient to call back to get annual scheduled. AM  ----- Message ----- From: Dema Severin, CMA Sent: 01/17/2022   4:08 PM EST To: [facility] Admin Pool Subject: Needs appt with T                              Last annual was in 2021. Please call patient to get scheduled. Gave 30 day supply of meds.   Kent Scott

## 2022-02-12 ENCOUNTER — Other Ambulatory Visit: Payer: Self-pay | Admitting: Sports Medicine

## 2022-02-12 DIAGNOSIS — Z8619 Personal history of other infectious and parasitic diseases: Secondary | ICD-10-CM

## 2022-03-07 ENCOUNTER — Other Ambulatory Visit (HOSPITAL_COMMUNITY): Payer: Self-pay

## 2022-03-07 ENCOUNTER — Ambulatory Visit (INDEPENDENT_AMBULATORY_CARE_PROVIDER_SITE_OTHER): Payer: 59 | Admitting: Sports Medicine

## 2022-03-07 VITALS — BP 129/90 | HR 78 | Wt 226.0 lb

## 2022-03-07 DIAGNOSIS — G43809 Other migraine, not intractable, without status migrainosus: Secondary | ICD-10-CM

## 2022-03-07 DIAGNOSIS — N400 Enlarged prostate without lower urinary tract symptoms: Secondary | ICD-10-CM | POA: Diagnosis not present

## 2022-03-07 DIAGNOSIS — Z23 Encounter for immunization: Secondary | ICD-10-CM

## 2022-03-07 DIAGNOSIS — R7303 Prediabetes: Secondary | ICD-10-CM | POA: Diagnosis not present

## 2022-03-07 DIAGNOSIS — Z0000 Encounter for general adult medical examination without abnormal findings: Secondary | ICD-10-CM | POA: Diagnosis not present

## 2022-03-07 DIAGNOSIS — Z566 Other physical and mental strain related to work: Secondary | ICD-10-CM

## 2022-03-07 DIAGNOSIS — E782 Mixed hyperlipidemia: Secondary | ICD-10-CM

## 2022-03-07 DIAGNOSIS — G43909 Migraine, unspecified, not intractable, without status migrainosus: Secondary | ICD-10-CM | POA: Insufficient documentation

## 2022-03-07 DIAGNOSIS — E291 Testicular hypofunction: Secondary | ICD-10-CM

## 2022-03-07 MED ORDER — RIZATRIPTAN BENZOATE 5 MG PO TBDP
5.0000 mg | ORAL_TABLET | ORAL | 0 refills | Status: DC | PRN
  Filled 2022-03-07: qty 10, 28d supply, fill #0

## 2022-03-07 NOTE — Progress Notes (Signed)
?Subjective:   ? ?CC: Annual Physical Exam ? ?HPI:  ?This patient is here for their annual physical ? ?I reviewed the past medical history, family history, social history, surgical history, and allergies today and no changes were needed.  Please see the problem list section below in epic for further details. ? ?Past Medical History: ?No past medical history on file. ?Past Surgical History: ?Past Surgical History:  ?Procedure Laterality Date  ? stomach band    ? VASECTOMY    ? ?Social History: ?Social History  ? ?Socioeconomic History  ? Marital status: Single  ?  Spouse name: Not on file  ? Number of children: Not on file  ? Years of education: Not on file  ? Highest education level: Not on file  ?Occupational History  ? Not on file  ?Tobacco Use  ? Smoking status: Never  ? Smokeless tobacco: Never  ?Substance and Sexual Activity  ? Alcohol use: Not Currently  ? Drug use: Never  ? Sexual activity: Not on file  ?Other Topics Concern  ? Not on file  ?Social History Narrative  ? Not on file  ? ?Social Determinants of Health  ? ?Financial Resource Strain: Not on file  ?Food Insecurity: Not on file  ?Transportation Needs: Not on file  ?Physical Activity: Not on file  ?Stress: Not on file  ?Social Connections: Not on file  ? ?Family History: ?Family History  ?Problem Relation Age of Onset  ? COPD Father   ? ?Allergies: ?Allergies  ?Allergen Reactions  ? Iodine Nausea And Vomiting  ? Shellfish Allergy Nausea And Vomiting  ? Pollen Extract   ?  seasonal  ? ?Medications: See med rec. ? ?Review of Systems: No headache, visual changes, nausea, vomiting, diarrhea, constipation, dizziness, abdominal pain, skin rash, fevers, chills, night sweats, swollen lymph nodes, weight loss, chest pain, body aches, joint swelling, muscle aches, shortness of breath, mood changes, visual or auditory hallucinations. ? ?Objective:   ? ?General: Well Developed, well nourished, and in no acute distress.  ?Neuro: Alert and oriented x3,  extra-ocular muscles intact, sensation grossly intact. Cranial nerves II through XII are intact, motor, sensory, and coordinative functions are all intact. ?HEENT: Normocephalic, atraumatic, pupils equal round reactive to light, neck supple, no masses, no lymphadenopathy, thyroid nonpalpable. Oropharynx, nasopharynx, external ear canals are unremarkable. ?Skin: Warm and dry, no rashes noted.  ?Cardiac: Regular rate and rhythm, no murmurs rubs or gallops.  ?Respiratory: Clear to auscultation bilaterally. Not using accessory muscles, speaking in full sentences.  ?Abdominal: Soft, nontender, nondistended, positive bowel sounds, no masses, no organomegaly.  ?Musculoskeletal: Shoulder, elbow, wrist, hip, knee, ankle stable, and with full range of motion. ? ?Impression and Recommendations:   ? ?The patient was counselled, risk factors were discussed, anticipatory guidance given. ? ?Annual physical exam ?Annual physical as above, up-to-date on screening measures, he will be due for Tdap in May of this year so we will go ahead and do it now. ?Return to see me in a year. ? ?Migraine headache ?Tonye Becket did describe a recent headache a week or so ago, right-sided, sharp with photophobia, phonophobia, nausea, lasted for a day. ?No focal neurologic symptoms associated, this does sound like a classic migraine, likely triggered by his very stressful professional life. ?We will add some Maxalt low-dose for use for migraine abortion. ? ?Mixed hyperlipidemia ?Did discuss some weight loss and then rechecking lipids, but this has not yet happened so we will give another 3 months or so before rechecking lipids. ? ?  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 ?He does feel like his stress is eroding into his interpersonal relationships and life. ?I did offer Zoloft but I would like him to look into this before we start the medication, he will call me and we  would likely start 25 mg with a 6-week follow-up and dose titration. ? ? ?___________________________________________ ?Ihor Austin. Benjamin Stain, M.D., ABFM., CAQSM. ?Primary Care and Sports Medicine ?[facility] ? ?Adjunct Professor of Family Medicine  ?[employer] ?

## 2022-03-07 NOTE — Assessment & Plan Note (Signed)
Annual physical as above, up-to-date on screening measures, he will be due for Tdap in May of this year so we will go ahead and do it now. ?Return to see me in a year. ?

## 2022-03-07 NOTE — Assessment & Plan Note (Signed)
Kent Scott did describe a recent headache a week or so ago, right-sided, sharp with photophobia, phonophobia, nausea, lasted for a day. ?No focal neurologic symptoms associated, this does sound like a classic migraine, likely triggered by his very stressful professional life. ?We will add some Maxalt low-dose for use for migraine abortion. ?

## 2022-03-07 NOTE — Assessment & Plan Note (Signed)
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 ?He does feel like his stress is eroding into his interpersonal relationships and life. ?I did offer Zoloft but I would like him to look into this before we start the medication, he will call me and we would likely start 25 mg with a 6-week follow-up and dose titration. ?

## 2022-03-07 NOTE — Addendum Note (Signed)
Addended by: Gaynelle Arabian on: 03/07/2022 10:14 AM ? ? Modules accepted: Orders ? ?

## 2022-03-07 NOTE — Assessment & Plan Note (Signed)
Did discuss some weight loss and then rechecking lipids, but this has not yet happened so we will give another 3 months or so before rechecking lipids. ?

## 2022-03-08 ENCOUNTER — Other Ambulatory Visit (HOSPITAL_COMMUNITY): Payer: Self-pay

## 2022-03-26 ENCOUNTER — Other Ambulatory Visit: Payer: Self-pay | Admitting: Sports Medicine

## 2022-03-26 DIAGNOSIS — Z8619 Personal history of other infectious and parasitic diseases: Secondary | ICD-10-CM

## 2022-03-27 ENCOUNTER — Other Ambulatory Visit (HOSPITAL_COMMUNITY): Payer: Self-pay

## 2022-03-27 ENCOUNTER — Other Ambulatory Visit: Payer: Self-pay

## 2022-03-27 DIAGNOSIS — Z8619 Personal history of other infectious and parasitic diseases: Secondary | ICD-10-CM

## 2022-03-27 MED ORDER — TADALAFIL 5 MG PO TABS
5.0000 mg | ORAL_TABLET | Freq: Every day | ORAL | 2 refills | Status: DC | PRN
  Filled 2022-03-27: qty 30, 30d supply, fill #0
  Filled 2022-06-12: qty 30, 30d supply, fill #1
  Filled 2022-08-21: qty 30, 30d supply, fill #2

## 2022-04-08 ENCOUNTER — Other Ambulatory Visit (HOSPITAL_COMMUNITY): Payer: Self-pay

## 2022-06-12 ENCOUNTER — Other Ambulatory Visit (HOSPITAL_COMMUNITY): Payer: Self-pay

## 2022-06-12 ENCOUNTER — Other Ambulatory Visit: Payer: Self-pay | Admitting: Sports Medicine

## 2022-06-12 DIAGNOSIS — G43809 Other migraine, not intractable, without status migrainosus: Secondary | ICD-10-CM

## 2022-06-12 MED ORDER — RIZATRIPTAN BENZOATE 5 MG PO TBDP
5.0000 mg | ORAL_TABLET | ORAL | 0 refills | Status: DC | PRN
  Filled 2022-06-12: qty 10, 28d supply, fill #0

## 2022-06-13 ENCOUNTER — Other Ambulatory Visit (HOSPITAL_COMMUNITY): Payer: Self-pay

## 2022-06-26 ENCOUNTER — Other Ambulatory Visit (HOSPITAL_COMMUNITY): Payer: Self-pay

## 2022-06-26 ENCOUNTER — Telehealth (INDEPENDENT_AMBULATORY_CARE_PROVIDER_SITE_OTHER): Payer: 59 | Admitting: Sports Medicine

## 2022-06-26 DIAGNOSIS — G43809 Other migraine, not intractable, without status migrainosus: Secondary | ICD-10-CM | POA: Diagnosis not present

## 2022-06-26 DIAGNOSIS — R519 Headache, unspecified: Secondary | ICD-10-CM

## 2022-06-26 DIAGNOSIS — G8929 Other chronic pain: Secondary | ICD-10-CM

## 2022-06-26 MED ORDER — RIZATRIPTAN BENZOATE 10 MG PO TBDP
10.0000 mg | ORAL_TABLET | ORAL | 11 refills | Status: DC | PRN
  Filled 2022-06-26: qty 10, 30d supply, fill #0
  Filled 2022-10-23: qty 10, 30d supply, fill #1

## 2022-06-26 NOTE — Assessment & Plan Note (Addendum)
Kent Scott is a very pleasant 51 year old male, he does have a history of migraine headaches, classic for migraines. Typically gets about 2 total headache days per month. Migraines are however debilitating with photophobia, phonophobia, and he really just needs to sleep in a dark room. 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 We prescribed Maxalt 5 last time which does tend to abort his migraines to some degree, with his persistent symptoms we will proceed with a brain MRI with and without contrast, increasing Maxalt to 10 mg as needed. As he is only having 2 total headache days per month we will hold off on preventative treatment. He is requesting a neurology consultation.  Update: [facility] has attempted to call Kent Scott multiple times for an appointment, he can just call them back if he desires to proceed with neurology consultation.

## 2022-06-26 NOTE — Progress Notes (Signed)
An attempt to call pt  was made at 10:27 am

## 2022-06-26 NOTE — Progress Notes (Addendum)
   Virtual Visit via WebEx/MyChart   I connected with  Kent Scott  on 07/02/22 via WebEx/MyChart/Doximity Video and verified that I am speaking with the correct person using two identifiers.   I discussed the limitations, risks, security and privacy concerns of performing an evaluation and management service by WebEx/MyChart/Doximity Video, including the higher likelihood of inaccurate diagnosis and treatment, and the availability of in person appointments.  We also discussed the likely need of an additional face to face encounter for complete and high quality delivery of care.  I also discussed with the patient that there may be a patient responsible charge related to this service. The patient expressed understanding and wishes to proceed.  Provider location is in medical facility. Patient location is at their home, different from provider location. People involved in care of the patient during this telehealth encounter were myself, my nurse/medical assistant, and my front office/scheduling team member.  Review of Systems: No fevers, chills, night sweats, weight loss, chest pain, or shortness of breath.   Objective Findings:    General: Speaking full sentences, no audible heavy breathing.  Sounds alert and appropriately interactive.  Appears well.  Face symmetric.  Extraocular movements intact.  Pupils equal and round.  No nasal flaring or accessory muscle use visualized.  Independent interpretation of tests performed by another provider:   None.  Brief History, Exam, Impression, and Recommendations:    Migraine headache Kent Scott is a very pleasant 51 year old male, he does have a history of migraine headaches, classic for migraines. Typically gets about 2 total headache days per month. Migraines are however debilitating with photophobia, phonophobia, and he really just needs to sleep in a dark room. He also has significant fatigue, and is not getting much sleep due to his business  obligations. We prescribed Maxalt 5 last time which does tend to abort his migraines to some degree, with his persistent symptoms we will proceed with a brain MRI with and without contrast, increasing Maxalt to 10 mg as needed. As he is only having 2 total headache days per month we will hold off on preventative treatment. He is requesting a neurology consultation.  Update: [facility] has attempted to call Kent Scott multiple times for an appointment, he can just call them back if he desires to proceed with neurology consultation.   I discussed the above assessment and treatment plan with the patient. The patient was provided an opportunity to ask questions and all were answered. The patient agreed with the plan and demonstrated an understanding of the instructions.   The patient was advised to call back or seek an in-person evaluation if the symptoms worsen or if the condition fails to improve as anticipated.   I provided 30 minutes of face to face and non-face-to-face time during this encounter date, time was needed to gather information, review chart, records, communicate/coordinate with staff remotely, as well as complete documentation.   ____________________________________________ Ihor Austin. Benjamin Stain, M.D., ABFM., CAQSM., AME. Primary Care and Sports Medicine [facility]  Adjunct Professor of Family Medicine  [place] [employer] [facility] [employer]  Restaurant manager, fast food

## 2022-06-27 DIAGNOSIS — N400 Enlarged prostate without lower urinary tract symptoms: Secondary | ICD-10-CM | POA: Diagnosis not present

## 2022-06-27 DIAGNOSIS — E782 Mixed hyperlipidemia: Secondary | ICD-10-CM | POA: Diagnosis not present

## 2022-06-27 DIAGNOSIS — R7303 Prediabetes: Secondary | ICD-10-CM | POA: Diagnosis not present

## 2022-06-27 DIAGNOSIS — E291 Testicular hypofunction: Secondary | ICD-10-CM | POA: Diagnosis not present

## 2022-07-01 ENCOUNTER — Encounter: Payer: Self-pay | Admitting: Sports Medicine

## 2022-07-03 LAB — TESTOSTERONE, FREE & TOTAL
Free Testosterone: 76.6 pg/mL (ref 35.0–155.0)
Testosterone, Total, LC-MS-MS: 662 ng/dL (ref 250–1100)

## 2022-07-03 LAB — COMPREHENSIVE METABOLIC PANEL
AG Ratio: 1.4 (calc) (ref 1.0–2.5)
ALT: 20 U/L (ref 9–46)
AST: 15 U/L (ref 10–40)
Albumin: 4.3 g/dL (ref 3.6–5.1)
Alkaline phosphatase (APISO): 51 U/L (ref 36–130)
BUN: 13 mg/dL (ref 7–25)
CO2: 26 mmol/L (ref 20–32)
Calcium: 9.7 mg/dL (ref 8.6–10.3)
Chloride: 104 mmol/L (ref 98–110)
Creat: 1.25 mg/dL (ref 0.60–1.29)
Globulin: 3.1 g/dL (calc) (ref 1.9–3.7)
Glucose, Bld: 93 mg/dL (ref 65–99)
Potassium: 4.5 mmol/L (ref 3.5–5.3)
Sodium: 140 mmol/L (ref 135–146)
Total Bilirubin: 0.6 mg/dL (ref 0.2–1.2)
Total Protein: 7.4 g/dL (ref 6.1–8.1)

## 2022-07-03 LAB — TSH: TSH: 1.78 mIU/L (ref 0.40–4.50)

## 2022-07-03 LAB — LIPID PANEL
Cholesterol: 193 mg/dL (ref <200)
HDL: 48 mg/dL (ref >=40)
LDL Cholesterol (Calc): 128 mg/dL (calc) — ABNORMAL HIGH
Non-HDL Cholesterol (Calc): 145 mg/dL (calc) — ABNORMAL HIGH (ref <130)
Total CHOL/HDL Ratio: 4 (calc) (ref <5.0)
Triglycerides: 75 mg/dL (ref <150)

## 2022-07-03 LAB — HEMOGLOBIN A1C
Hgb A1c MFr Bld: 5.7 % of total Hgb — ABNORMAL HIGH (ref <5.7)
Mean Plasma Glucose: 117 mg/dL
eAG (mmol/L): 6.5 mmol/L

## 2022-07-03 LAB — CBC
HCT: 43.3 % (ref 38.5–50.0)
Hemoglobin: 14.1 g/dL (ref 13.2–17.1)
MCH: 28.1 pg (ref 27.0–33.0)
MCHC: 32.6 g/dL (ref 32.0–36.0)
MCV: 86.3 fL (ref 80.0–100.0)
MPV: 10.5 fL (ref 7.5–12.5)
Platelets: 255 10*3/uL (ref 140–400)
RBC: 5.02 10*6/uL (ref 4.20–5.80)
RDW: 13.4 % (ref 11.0–15.0)
WBC: 5.3 10*3/uL (ref 3.8–10.8)

## 2022-07-03 LAB — PSA, TOTAL AND FREE
PSA, % Free: 29 % (calc) (ref >25)
PSA, Free: 0.2 ng/mL
PSA, Total: 0.7 ng/mL (ref <=4.0)

## 2022-07-04 ENCOUNTER — Encounter: Payer: Self-pay | Admitting: Sports Medicine

## 2022-07-04 ENCOUNTER — Ambulatory Visit (HOSPITAL_BASED_OUTPATIENT_CLINIC_OR_DEPARTMENT_OTHER)
Admission: RE | Admit: 2022-07-04 | Discharge: 2022-07-04 | Disposition: A | Discharge status: Home / Self Care | Payer: 59 | Source: Ambulatory Visit | Attending: Sports Medicine | Admitting: Sports Medicine

## 2022-07-04 DIAGNOSIS — G8929 Other chronic pain: Secondary | ICD-10-CM | POA: Diagnosis not present

## 2022-07-04 DIAGNOSIS — R519 Headache, unspecified: Secondary | ICD-10-CM | POA: Insufficient documentation

## 2022-07-04 DIAGNOSIS — G43809 Other migraine, not intractable, without status migrainosus: Secondary | ICD-10-CM

## 2022-07-04 MED ORDER — GADOBUTROL 1 MMOL/ML IV SOLN
10.0000 mL | Freq: Once | INTRAVENOUS | Status: AC | PRN
  Administered 2022-07-04: 10 mL via INTRAVENOUS
  Filled 2022-07-04: qty 10

## 2022-08-21 ENCOUNTER — Other Ambulatory Visit (HOSPITAL_COMMUNITY): Payer: Self-pay

## 2022-08-24 ENCOUNTER — Other Ambulatory Visit (HOSPITAL_COMMUNITY): Payer: Self-pay

## 2022-08-27 ENCOUNTER — Other Ambulatory Visit (HOSPITAL_COMMUNITY): Payer: Self-pay

## 2022-10-23 ENCOUNTER — Other Ambulatory Visit (HOSPITAL_COMMUNITY): Payer: Self-pay

## 2022-10-23 DIAGNOSIS — K219 Gastro-esophageal reflux disease without esophagitis: Secondary | ICD-10-CM | POA: Diagnosis not present

## 2022-10-23 DIAGNOSIS — N400 Enlarged prostate without lower urinary tract symptoms: Secondary | ICD-10-CM | POA: Diagnosis not present

## 2022-10-23 DIAGNOSIS — E7800 Pure hypercholesterolemia, unspecified: Secondary | ICD-10-CM | POA: Diagnosis not present

## 2022-10-23 DIAGNOSIS — R079 Chest pain, unspecified: Secondary | ICD-10-CM | POA: Diagnosis not present

## 2022-10-23 DIAGNOSIS — I4510 Unspecified right bundle-branch block: Secondary | ICD-10-CM | POA: Diagnosis not present

## 2022-10-23 DIAGNOSIS — G43909 Migraine, unspecified, not intractable, without status migrainosus: Secondary | ICD-10-CM | POA: Diagnosis not present

## 2022-10-23 DIAGNOSIS — I10 Essential (primary) hypertension: Secondary | ICD-10-CM | POA: Diagnosis not present

## 2022-10-23 DIAGNOSIS — R519 Headache, unspecified: Secondary | ICD-10-CM | POA: Diagnosis not present

## 2022-10-23 DIAGNOSIS — Z8249 Family history of ischemic heart disease and other diseases of the circulatory system: Secondary | ICD-10-CM | POA: Diagnosis not present

## 2022-10-29 ENCOUNTER — Other Ambulatory Visit (HOSPITAL_COMMUNITY): Payer: Self-pay

## 2022-10-29 ENCOUNTER — Other Ambulatory Visit: Payer: Self-pay | Admitting: Sports Medicine

## 2022-10-29 DIAGNOSIS — Z8619 Personal history of other infectious and parasitic diseases: Secondary | ICD-10-CM

## 2022-10-30 ENCOUNTER — Ambulatory Visit: Payer: 59 | Admitting: Sports Medicine

## 2022-11-01 ENCOUNTER — Other Ambulatory Visit: Payer: Self-pay | Admitting: Sports Medicine

## 2022-11-01 ENCOUNTER — Other Ambulatory Visit (HOSPITAL_COMMUNITY): Payer: Self-pay

## 2022-11-01 DIAGNOSIS — Z8619 Personal history of other infectious and parasitic diseases: Secondary | ICD-10-CM

## 2022-11-04 ENCOUNTER — Ambulatory Visit: Payer: 59 | Admitting: Sports Medicine

## 2022-11-04 ENCOUNTER — Encounter: Payer: Self-pay | Admitting: Sports Medicine

## 2022-11-04 ENCOUNTER — Other Ambulatory Visit (HOSPITAL_COMMUNITY): Payer: Self-pay

## 2022-11-04 VITALS — BP 126/86 | HR 72 | Wt 230.0 lb

## 2022-11-04 DIAGNOSIS — Z8619 Personal history of other infectious and parasitic diseases: Secondary | ICD-10-CM

## 2022-11-04 DIAGNOSIS — E291 Testicular hypofunction: Secondary | ICD-10-CM | POA: Diagnosis not present

## 2022-11-04 DIAGNOSIS — K219 Gastro-esophageal reflux disease without esophagitis: Secondary | ICD-10-CM | POA: Diagnosis not present

## 2022-11-04 DIAGNOSIS — G43809 Other migraine, not intractable, without status migrainosus: Secondary | ICD-10-CM

## 2022-11-04 MED ORDER — TADALAFIL 5 MG PO TABS: 5.0000 mg | ORAL_TABLET | Freq: Every day | ORAL | 11 refills | Status: AC | PRN

## 2022-11-04 MED ORDER — RIZATRIPTAN BENZOATE 10 MG PO TBDP: 10.0000 mg | ORAL_TABLET | ORAL | 11 refills | Status: DC | PRN

## 2022-11-04 MED ORDER — TESTOSTERONE CYPIONATE 200 MG/ML IM SOLN: 200.0000 mg | INTRAMUSCULAR | 3 refills | Status: DC

## 2022-11-04 MED ORDER — FAMOTIDINE 40 MG PO TABS: 40.0000 mg | ORAL_TABLET | Freq: Every day | ORAL | 11 refills | Status: AC

## 2022-11-04 MED ORDER — TOPIRAMATE 50 MG PO TABS: ORAL_TABLET | ORAL | 3 refills | Status: AC

## 2022-11-04 NOTE — Assessment & Plan Note (Signed)
Refilling testosterone and Cialis. We last checked levels in July.

## 2022-11-04 NOTE — Progress Notes (Signed)
    Procedures performed today:    None.  Independent interpretation of notes and tests performed by another provider:   None.  Brief History, Exam, Impression, and Recommendations:    Migraine headache Kent Scott returns, he is a very pleasant 51 year old male with classic migraines, he gets an aura, photophobia, phonophobia, nausea. He finds that Maxalt 10 mg does work as needed but he would like something to prevent his headaches. He did have a brain MRI that was negative with the exception of typical migrainous T2 hyperintensities. We will go and start Topamax at a very low-dose, half pill at night for a week and then 1 full pill at night, he will keep a migraine diary, refilling Maxalt.  Male hypogonadism Refilling testosterone and Cialis. We last checked levels in July.  GERD (gastroesophageal reflux disease) GERD symptoms, status post gastric band. Adding Pepcid, he will avoid eating within 2 to 3 hours of bedtime. We can escalate care to a GI referral if needed at the follow-up visit in a month.    ____________________________________________ Kent Scott. Kent Scott, M.D., ABFM., CAQSM., AME. Primary Care and Sports Medicine [facility]  Adjunct Professor of Family Medicine  [place] [employer] [facility] [employer]  Restaurant manager, fast food

## 2022-11-04 NOTE — Assessment & Plan Note (Signed)
GERD symptoms, status post gastric band. Adding Pepcid, he will avoid eating within 2 to 3 hours of bedtime. We can escalate care to a GI referral if needed at the follow-up visit in a month.

## 2022-11-04 NOTE — Assessment & Plan Note (Signed)
Kent Scott returns, he is a very pleasant 51 year old male with classic migraines, he gets an aura, photophobia, phonophobia, nausea. He finds that Maxalt 10 mg does work as needed but he would like something to prevent his headaches. He did have a brain MRI that was negative with the exception of typical migrainous T2 hyperintensities. We will go and start Topamax at a very low-dose, half pill at night for a week and then 1 full pill at night, he will keep a migraine diary, refilling Maxalt.

## 2022-11-05 ENCOUNTER — Other Ambulatory Visit (HOSPITAL_COMMUNITY): Payer: Self-pay

## 2022-11-05 ENCOUNTER — Other Ambulatory Visit: Payer: Self-pay | Admitting: Sports Medicine

## 2022-11-05 NOTE — Telephone Encounter (Signed)
I can switch to testosterone cream, but testosterone cypionate injections have not been discontinued, and we still use it quite frequently for many of our hypogonadal men getting testosterone injections.  Have him clarify.  He may need to just try a different pharmacy or have his current pharmacy order it.

## 2022-11-05 NOTE — Telephone Encounter (Signed)
Patient came into office about this Testerone cypionate being discontinued, and patient would like Hormone Cream Base instead of injection, patient goes out of town tomorrow morning, pleae advise, thanks!

## 2022-11-06 NOTE — Telephone Encounter (Signed)
Attempted to call and couldn't leave VM due to mailbox being full.

## 2022-11-08 ENCOUNTER — Other Ambulatory Visit (HOSPITAL_COMMUNITY): Payer: Self-pay

## 2022-12-03 ENCOUNTER — Encounter: Payer: Self-pay | Admitting: Sports Medicine

## 2022-12-03 ENCOUNTER — Ambulatory Visit: Payer: 59 | Admitting: Sports Medicine

## 2022-12-03 VITALS — BP 137/87 | HR 88 | Wt 238.0 lb

## 2022-12-03 DIAGNOSIS — Z0000 Encounter for general adult medical examination without abnormal findings: Secondary | ICD-10-CM | POA: Diagnosis not present

## 2022-12-03 DIAGNOSIS — E291 Testicular hypofunction: Secondary | ICD-10-CM | POA: Diagnosis not present

## 2022-12-03 DIAGNOSIS — G43809 Other migraine, not intractable, without status migrainosus: Secondary | ICD-10-CM | POA: Diagnosis not present

## 2022-12-03 NOTE — Assessment & Plan Note (Signed)
Kent Scott returns, he is doing well with his testosterone, he does have some refills, Cialis is working well, return as needed for this. Will probably recheck his levels in 6 months at an annual physical.

## 2022-12-03 NOTE — Assessment & Plan Note (Signed)
Kent Scott has classic migraines, aura, photophobia, photophobia, nausea, Maxalt 10 works but he would like something to prevent his headaches, brain MRI was negative with the exception of the typical T2 hyperintensities that we often see in migraines, I started Topamax low-dose followed by 1 field pill nightly, he is doing really well and only had 1 migraine day over the last month. No changes in plan, return as needed.

## 2022-12-03 NOTE — Progress Notes (Signed)
    Procedures performed today:    None.  Independent interpretation of notes and tests performed by another provider:   None.  Brief History, Exam, Impression, and Recommendations:    Male hypogonadism Kent Scott returns, he is doing well with his testosterone, he does have some refills, Cialis is working well, return as needed for this. Will probably recheck his levels in 6 months at an annual physical.  Annual physical exam We last did Kent Scott's annual physical in March of this year, he was up-to-date on screening measures, he will be due for other annual physical in the spring 2024.  Advised him to come fasting right in between testosterone shots.  Migraine headache Kent Scott has classic migraines, aura, photophobia, photophobia, nausea, Maxalt 10 works but he would like something to prevent his headaches, brain MRI was negative with the exception of the typical T2 hyperintensities that we often see in migraines, I started Topamax low-dose followed by 1 field pill nightly, he is doing really well and only had 1 migraine day over the last month. No changes in plan, return as needed.    ____________________________________________ Ihor Austin. Benjamin Stain, M.D., ABFM., CAQSM., AME. Primary Care and Sports Medicine [facility]  Adjunct Professor of Family Medicine  [place] [employer] [facility] [employer]  Restaurant manager, fast food

## 2022-12-03 NOTE — Assessment & Plan Note (Signed)
We last did Kent Scott's annual physical in March of this year, he was up-to-date on screening measures, he will be due for other annual physical in the spring 2024.  Advised him to come fasting right in between testosterone shots.

## 2022-12-30 ENCOUNTER — Other Ambulatory Visit (HOSPITAL_COMMUNITY): Payer: Self-pay

## 2023-01-31 ENCOUNTER — Encounter: Payer: Self-pay | Admitting: Physician Assistant

## 2023-01-31 ENCOUNTER — Ambulatory Visit (INDEPENDENT_AMBULATORY_CARE_PROVIDER_SITE_OTHER): Payer: Commercial Managed Care - HMO | Admitting: Physician Assistant

## 2023-01-31 VITALS — BP 125/94 | HR 69 | Ht 68.0 in | Wt 230.0 lb

## 2023-01-31 DIAGNOSIS — L600 Ingrowing nail: Secondary | ICD-10-CM | POA: Diagnosis not present

## 2023-01-31 DIAGNOSIS — L03031 Cellulitis of right toe: Secondary | ICD-10-CM | POA: Diagnosis not present

## 2023-01-31 MED ORDER — DOXYCYCLINE HYCLATE 100 MG PO TABS: 100.0000 mg | ORAL_TABLET | Freq: Two times a day (BID) | ORAL | 0 refills | Status: DC

## 2023-01-31 NOTE — Progress Notes (Signed)
   Acute Office Visit  Subjective:     Patient ID: Kent Scott, male    DOB: 09-24-1971, 52 y.o.   MRN: [id number]  Chief Complaint  Patient presents with   ingrown toe nail     HPI Patient is in today for an ingrown toenail on the first toe of the right foot. He noticed it last night with associated pain and redness. He took an Epsom salt bath and Ibuprofen last night which gave him some relief. He has a history of these and usually removes the ingrown nail himself. This one was more tender than his usual and made him worry about possible infection. No fever or chills.   ROS See HPI     Objective:    BP (!) 125/94 (BP Location: Right Arm, Patient Position: Sitting, Cuff Size: Large)   Pulse 69   Ht 5' 8"$  (1.727 m)   Wt 230 lb 0.6 oz (104.3 kg)   SpO2 100%   BMI 34.98 kg/m  BP Readings from Last 3 Encounters:  01/31/23 (!) 125/94  12/03/22 137/87  11/04/22 126/86   Wt Readings from Last 3 Encounters:  01/31/23 230 lb 0.6 oz (104.3 kg)  12/03/22 238 lb (108 kg)  11/04/22 230 lb (104.3 kg)      Physical Exam Right foot: Ingrown toenail of the first toe with erythema. Tender to palpation. No active drainage but medial nail cuticle swollen and warm.        Assessment & Plan:  Marland KitchenMarland KitchenMartinez was seen today for ingrown toe nail .  Diagnoses and all orders for this visit:  Paronychia of toe of right foot due to ingrown toenail -     doxycycline (VIBRA-TABS) 100 MG tablet; Take 1 tablet (100 mg total) by mouth 2 (two) times daily.    Start taking Doxycycline BID x 10 days Educated on proper nail care to prevent these  Continue epsom salt baths every night  Wear open toed shoes when possible Continue Ibuprofen as needed for pain  If symptoms persist schedule appt for partial toenail removal HO given  Iran Planas, PA-C

## 2023-01-31 NOTE — Patient Instructions (Signed)
Ingrown Toenail  An ingrown toenail occurs when the corner or sides of a toenail grow into the surrounding skin. This causes discomfort and pain. The big toe is most commonly affected, but any of the toes can be affected. If an ingrown toenail is not treated, it can become infected. What are the causes? This condition may be caused by: Wearing shoes that are too small or tight. An injury, such as stubbing your toe or having your toe stepped on. Improper cutting or care of your toenails. Having nail or foot abnormalities that were present from birth (congenital abnormalities), such as having a nail that is too big for your toe. What increases the risk? The following factors may make you more likely to develop ingrown toenails: Age. Nails tend to get thicker with age, so ingrown nails are more common among older people. Cutting your toenails incorrectly, such as cutting them very short or cutting them unevenly. An ingrown toenail is more likely to get infected if you have: Diabetes. Blood flow (circulation) problems. What are the signs or symptoms? Symptoms of an ingrown toenail may include: Pain, soreness, or tenderness. Redness. Swelling. Hardening of the skin that surrounds the toenail. Signs that an ingrown toenail may be infected include: Fluid or pus. Symptoms that get worse. How is this diagnosed? Ingrown toenails may be diagnosed based on: Your symptoms and medical history. A physical exam. Labs or tests. If you have fluid or blood coming from your toenail, a sample may be collected to test for the specific type of bacteria that is causing the infection. How is this treated? Treatment depends on the severity of your symptoms. You may be able to care for your toenail at home. If you have an infection, you may be prescribed antibiotic medicines. If you have fluid or pus draining from your toenail, your health care provider may drain it. If you have trouble walking, you may be  given crutches to use. If you have a severe or infected ingrown toenail, you may need a procedure to remove part or all of the nail. Follow these instructions at home: [place] your wound every day for signs of infection, or as often as told by your health care provider. Check for: More redness, swelling, or pain. More fluid or blood. Warmth. Pus or a bad smell. Do not pick at your toenail or try to remove it yourself. Soak your foot in warm, soapy water. Do this for 20 minutes, 3 times a day, or as often as told by your health care provider. This helps to keep your toe clean and your skin soft. Wear shoes that fit well and are not too tight. Your health care provider may recommend that you wear open-toed shoes while you heal. Trim your toenails regularly and carefully. Cut your toenails straight across to prevent injury to the skin at the corners of the toenail. Do not cut your nails in a curved shape. Keep your feet clean and dry to help prevent infection. General instructions Take over-the-counter and prescription medicines only as told by your health care provider. If you were prescribed an antibiotic, take it as told by your health care provider. Do not stop taking the antibiotic even if you start to feel better. If your health care provider told you to use crutches to help you move around, use them as instructed. Return to your normal activities as told by your health care provider. Ask your health care provider what activities are safe for you.  Keep all follow-up visits. This is important. Contact a health care provider if: You have more redness, swelling, pain, or other symptoms that do not improve with treatment. You have fluid, blood, or pus coming from your toenail. You have a red streak on your skin that starts at your foot and spreads up your leg. You have a fever. Summary An ingrown toenail occurs when the corner or sides of a toenail grow into the surrounding skin.  This causes discomfort and pain. The big toe is most commonly affected, but any of the toes can be affected. If an ingrown toenail is not treated, it can become infected. Fluid or pus draining from your toenail is a sign of infection. Your health care provider may need to drain it. You may be given antibiotics to treat the infection. Trimming your toenails regularly and properly can help you prevent an ingrown toenail. This information is not intended to replace advice given to you by your health care provider. Make sure you discuss any questions you have with your health care provider. Document Revised: 03/27/2021 Document Reviewed: 03/27/2021 Elsevier Patient Education  [place].

## 2023-03-04 ENCOUNTER — Encounter: Payer: Self-pay | Admitting: Family Medicine

## 2023-03-04 ENCOUNTER — Ambulatory Visit (INDEPENDENT_AMBULATORY_CARE_PROVIDER_SITE_OTHER): Payer: Commercial Managed Care - HMO | Admitting: Family Medicine

## 2023-03-04 VITALS — BP 125/81 | HR 93 | Ht 68.0 in | Wt 223.0 lb

## 2023-03-04 DIAGNOSIS — R300 Dysuria: Secondary | ICD-10-CM | POA: Diagnosis not present

## 2023-03-04 DIAGNOSIS — N3001 Acute cystitis with hematuria: Secondary | ICD-10-CM | POA: Diagnosis not present

## 2023-03-04 LAB — POCT URINALYSIS DIP (CLINITEK)
Bilirubin, UA: NEGATIVE
Glucose, UA: NEGATIVE mg/dL
Leukocytes, UA: NEGATIVE
Nitrite, UA: NEGATIVE
POC PROTEIN,UA: 100 — AB
Spec Grav, UA: >=1.03 — AB (ref 1.010–1.025)
Urobilinogen, UA: 1 E.U./dL
pH, UA: 6.5 (ref 5.0–8.0)

## 2023-03-04 MED ORDER — CEPHALEXIN 500 MG PO CAPS: 500.0000 mg | ORAL_CAPSULE | Freq: Two times a day (BID) | ORAL | 0 refills | Status: AC

## 2023-03-04 NOTE — Progress Notes (Signed)
Established patient visit   Patient: Kent Scott   DOB: May 12, 1971   52 y.o. Male  MRN: [id number] Visit Date: 03/04/2023  Today's healthcare provider: Owens Loffler, DO   Chief Complaint  Patient presents with   Dysuria    SUBJECTIVE    Chief Complaint  Patient presents with   Dysuria   HPI  Pt presents for dysuria.  He was traveling last week and started to have burning with urination and pain two days ago. He also noted a fever. He denies any concern for stds.   Review of Systems  Constitutional:  Negative for activity change, fatigue and fever.  Respiratory:  Negative for cough and shortness of breath.   Cardiovascular:  Negative for chest pain.  Gastrointestinal:  Negative for abdominal pain.  Genitourinary:  Negative for difficulty urinating.      Current Meds  Medication Sig   Blood Pressure Monitoring (OMRON 3 SERIES BP MONITOR) DEVI USE AS DIRECTED   cephALEXin (KEFLEX) 500 MG capsule Take 1 capsule (500 mg total) by mouth 2 (two) times daily for 5 days.   doxycycline (VIBRA-TABS) 100 MG tablet Take 1 tablet (100 mg total) by mouth 2 (two) times daily.   famotidine (PEPCID) 40 MG tablet Take 1 tablet (40 mg total) by mouth daily.   Hormone Cream Base (HRT BASE) CREA    ipratropium (ATROVENT) 0.03 % nasal spray Place 2 sprays into both nostrils every 12 (twelve) hours.   rizatriptan (MAXALT-MLT) 10 MG disintegrating tablet Dissolve 1 tablet (10 mg total) by mouth as needed for migraine (May repeat x1 in 2 hours if needed).   tadalafil (CIALIS) 5 MG tablet Take 1 tablet (5 mg total) by mouth daily as needed for erectile dysfunction.   testosterone cypionate (DEPOTESTOSTERONE CYPIONATE) 200 MG/ML injection Inject 1 mL (200 mg total) into the muscle every 14 (fourteen) days.   topiramate (TOPAMAX) 50 MG tablet One half tab at night for a week then 1 tab at night    OBJECTIVE    BP 125/81   Pulse 93   Ht 5\' 8"  (1.727 m)   Wt 223 lb (101.2 kg)   SpO2  99%   BMI 33.91 kg/m   Physical Exam Vitals and nursing note reviewed.  Constitutional:      General: He is not in acute distress.    Appearance: Normal appearance.  HENT:     Head: Normocephalic and atraumatic.     Right Ear: External ear normal.     Left Ear: External ear normal.     Nose: Nose normal.  Eyes:     Conjunctiva/sclera: Conjunctivae normal.  Cardiovascular:     Rate and Rhythm: Normal rate and regular rhythm.  Pulmonary:     Effort: Pulmonary effort is normal.     Breath sounds: Normal breath sounds.  Neurological:     General: No focal deficit present.     Mental Status: He is alert and oriented to person, place, and time.  Psychiatric:        Mood and Affect: Mood normal.        Behavior: Behavior normal.        Thought Content: Thought content normal.        Judgment: Judgment normal.          ASSESSMENT & PLAN    Problem List Items Addressed This Visit       Other   Dysuria - Primary    Patient presents  with dysuria. Pt denies any concerns for stds today - Point-of-care UA done in clinic which shows specific gravity greater than 1.03 indicating dehydration, trace blood.  Negative nitrites and negative leukocytes. - due to symptoms and hematuria will go ahead and treat for UTI. Given keflex - asked patient to come back to clinic for repeat UA to ensure hematuria has resolved, if not need to consider further workup - did advise patient to drink more fluids  - we will go ahead and culture      Relevant Orders   POCT URINALYSIS DIP (CLINITEK) (Completed)   Urine Culture   Other Visit Diagnoses     Acute cystitis with hematuria       Relevant Medications   cephALEXin (KEFLEX) 500 MG capsule       Return in about 1 week (around 03/11/2023) for repeat UA to monitor hematuria.      Meds ordered this encounter  Medications   cephALEXin (KEFLEX) 500 MG capsule    Sig: Take 1 capsule (500 mg total) by mouth 2 (two) times daily for 5 days.     Dispense:  10 capsule    Refill:  0    Orders Placed This Encounter  Procedures   Urine Culture   POCT URINALYSIS DIP (CLINITEK)     Owens Loffler, DO  [facility] [phone] (phone) [phone] (fax)  [place]

## 2023-03-04 NOTE — Assessment & Plan Note (Addendum)
Patient presents with dysuria. Pt denies any concerns for stds today - Point-of-care UA done in clinic which shows specific gravity greater than 1.03 indicating dehydration, trace blood.  Negative nitrites and negative leukocytes. - due to symptoms and hematuria will go ahead and treat for UTI. Given keflex - asked patient to come back to clinic for repeat UA to ensure hematuria has resolved, if not need to consider further workup - did advise patient to drink more fluids  - we will go ahead and culture

## 2023-03-06 LAB — URINE CULTURE
MICRO NUMBER:: 14743107
Result:: NO GROWTH
SPECIMEN QUALITY:: ADEQUATE

## 2023-03-10 ENCOUNTER — Encounter: Payer: Self-pay | Admitting: Sports Medicine

## 2023-03-10 ENCOUNTER — Ambulatory Visit (INDEPENDENT_AMBULATORY_CARE_PROVIDER_SITE_OTHER): Payer: Commercial Managed Care - HMO | Admitting: Sports Medicine

## 2023-03-10 VITALS — BP 131/81 | HR 73 | Ht 68.0 in | Wt 226.0 lb

## 2023-03-10 DIAGNOSIS — Z0000 Encounter for general adult medical examination without abnormal findings: Secondary | ICD-10-CM

## 2023-03-10 DIAGNOSIS — N400 Enlarged prostate without lower urinary tract symptoms: Secondary | ICD-10-CM

## 2023-03-10 DIAGNOSIS — E291 Testicular hypofunction: Secondary | ICD-10-CM | POA: Diagnosis not present

## 2023-03-10 DIAGNOSIS — Z1211 Encounter for screening for malignant neoplasm of colon: Secondary | ICD-10-CM

## 2023-03-10 DIAGNOSIS — G4719 Other hypersomnia: Secondary | ICD-10-CM

## 2023-03-10 DIAGNOSIS — R300 Dysuria: Secondary | ICD-10-CM | POA: Diagnosis not present

## 2023-03-10 DIAGNOSIS — E782 Mixed hyperlipidemia: Secondary | ICD-10-CM

## 2023-03-10 NOTE — Assessment & Plan Note (Signed)
Pulling the trigger for sleep study

## 2023-03-10 NOTE — Assessment & Plan Note (Signed)
Recent episode of prostatitis treated by Dr. Mel Almond successfully. No further symptoms, no further testing needed.

## 2023-03-10 NOTE — Progress Notes (Signed)
  Subjective:    CC: Annual Physical Exam  HPI:  This patient is here for their annual physical  I reviewed the past medical history, family history, social history, surgical history, and allergies today and no changes were needed.  Please see the problem list section below in epic for further details.  Past Medical History: No past medical history on file. Past Surgical History: Past Surgical History:  Procedure Laterality Date   stomach band     VASECTOMY     Social History: Social History   Socioeconomic History   Marital status: Married    Spouse name: Not on file   Number of children: Not on file   Years of education: Not on file   Highest education level: Not on file  Occupational History   Not on file  Tobacco Use   Smoking status: Never   Smokeless tobacco: Never  Substance and Sexual Activity   Alcohol use: Not Currently   Drug use: Never   Sexual activity: Not on file  Other Topics Concern   Not on file  Social History Narrative   Not on file   Social Determinants of Health   Financial Resource Strain: Not on file  Food Insecurity: Not on file  Transportation Needs: Not on file  Physical Activity: Not on file  Stress: Not on file  Social Connections: Not on file   Family History: Family History  Problem Relation Age of Onset   COPD Father    Allergies: Allergies  Allergen Reactions   Iodine Nausea And Vomiting   Shellfish Allergy Nausea And Vomiting   Pollen Extract     seasonal   Medications: See med rec.  Review of Systems: No headache, visual changes, nausea, vomiting, diarrhea, constipation, dizziness, abdominal pain, skin rash, fevers, chills, night sweats, swollen lymph nodes, weight loss, chest pain, body aches, joint swelling, muscle aches, shortness of breath, mood changes, visual or auditory hallucinations.  Objective:    General: Well Developed, well nourished, and in no acute distress.  Neuro: Alert and oriented x3,  extra-ocular muscles intact, sensation grossly intact. Cranial nerves II through XII are intact, motor, sensory, and coordinative functions are all intact. HEENT: Normocephalic, atraumatic, pupils equal round reactive to light, neck supple, no masses, no lymphadenopathy, thyroid nonpalpable. Oropharynx, nasopharynx, external ear canals are unremarkable. Skin: Warm and dry, no rashes noted.  Cardiac: Regular rate and rhythm, no murmurs rubs or gallops.  Respiratory: Clear to auscultation bilaterally. Not using accessory muscles, speaking in full sentences.  Abdominal: Soft, nontender, nondistended, positive bowel sounds, no masses, no organomegaly.  Musculoskeletal: Shoulder, elbow, wrist, hip, knee, ankle stable, and with full range of motion.  Impression and Recommendations:    The patient was counselled, risk factors were discussed, anticipatory guidance given.  Annual physical exam Annual physical as above, getting routine screening measures, due for Cologuard.  Dysuria Recent episode of prostatitis treated by Dr. Mel Almond successfully. No further symptoms, no further testing needed.  Excessive daytime sleepiness Pulling the trigger for sleep study   ____________________________________________ Gwen Her. Dianah Field, M.D., ABFM., CAQSM., AME. Primary Care and Sports Medicine [facility]  Adjunct Professor of [place] [employer] Medicine  Risk manager

## 2023-03-10 NOTE — Assessment & Plan Note (Signed)
Annual physical as above, getting routine screening measures, due for Cologuard.

## 2023-03-11 ENCOUNTER — Ambulatory Visit: Payer: Commercial Managed Care - HMO | Admitting: Family Medicine

## 2023-04-02 ENCOUNTER — Ambulatory Visit (HOSPITAL_BASED_OUTPATIENT_CLINIC_OR_DEPARTMENT_OTHER): Payer: Commercial Managed Care - HMO | Attending: Sports Medicine | Admitting: Internal Medicine

## 2023-04-22 ENCOUNTER — Telehealth: Payer: Self-pay | Admitting: Sports Medicine

## 2023-04-22 NOTE — Telephone Encounter (Signed)
Spouse called. She states that PA is required on Tadalafil 5mg  (since switching to [facility] on [address])

## 2023-05-01 ENCOUNTER — Telehealth: Payer: Self-pay

## 2023-05-01 NOTE — Telephone Encounter (Signed)
Initiated Prior authorization for:Tadalafil 5MG  tablets  Via: Covermymeds Case/Key:[id number] Status: approved  as of 05/01/23 Reason:;Coverage Start Date:05/01/2023;Coverage End Date:04/30/2024; Notified Pt via: Mychart

## 2023-09-11 ENCOUNTER — Encounter: Payer: Self-pay | Admitting: Physician Assistant

## 2023-09-12 ENCOUNTER — Other Ambulatory Visit: Payer: Self-pay | Admitting: Sports Medicine

## 2023-09-12 DIAGNOSIS — Z1211 Encounter for screening for malignant neoplasm of colon: Secondary | ICD-10-CM

## 2023-09-12 DIAGNOSIS — Z1212 Encounter for screening for malignant neoplasm of rectum: Secondary | ICD-10-CM

## 2023-12-19 ENCOUNTER — Encounter: Payer: Commercial Managed Care - HMO | Admitting: Sports Medicine

## 2023-12-24 ENCOUNTER — Encounter: Payer: Commercial Managed Care - HMO | Admitting: Sports Medicine

## 2023-12-31 ENCOUNTER — Encounter: Payer: Commercial Managed Care - HMO | Admitting: Sports Medicine

## 2024-03-10 ENCOUNTER — Encounter: Payer: Commercial Managed Care - HMO | Admitting: Sports Medicine

## 2024-06-14 DIAGNOSIS — G4733 Obstructive sleep apnea (adult) (pediatric): Secondary | ICD-10-CM | POA: Insufficient documentation

## 2024-08-10 ENCOUNTER — Encounter: Payer: Self-pay | Admitting: Sports Medicine

## 2024-09-01 ENCOUNTER — Ambulatory Visit
Admission: EM | Admit: 2024-09-01 | Discharge: 2024-09-01 | Disposition: A | Discharge status: Home / Self Care | Attending: Internal Medicine | Admitting: Internal Medicine

## 2024-09-01 ENCOUNTER — Other Ambulatory Visit: Payer: Self-pay

## 2024-09-01 DIAGNOSIS — N410 Acute prostatitis: Secondary | ICD-10-CM | POA: Insufficient documentation

## 2024-09-01 DIAGNOSIS — N4889 Other specified disorders of penis: Secondary | ICD-10-CM | POA: Diagnosis present

## 2024-09-01 HISTORY — DX: Migraine, unspecified, not intractable, without status migrainosus: G43.909

## 2024-09-01 LAB — POCT URINE DIPSTICK
Bilirubin, UA: NEGATIVE
Blood, UA: NEGATIVE
Glucose, UA: NEGATIVE mg/dL
Ketones, POC UA: NEGATIVE mg/dL
Leukocytes, UA: NEGATIVE
Nitrite, UA: NEGATIVE
POC PROTEIN,UA: 30 — AB
Spec Grav, UA: 1.025 (ref 1.010–1.025)
Urobilinogen, UA: 0.2 U/dL
pH, UA: 6 (ref 5.0–8.0)

## 2024-09-01 MED ORDER — LEVOFLOXACIN 500 MG PO TABS: 500.0000 mg | ORAL_TABLET | Freq: Every day | ORAL | 0 refills | Status: AC

## 2024-09-01 NOTE — Discharge Instructions (Addendum)
 Your swab and urine were sent to the lab for further testing.  You will be called with results. Levaquin  is an antibiotic given to treat prostatitis. Take the prescription as directed.  You should avoid all sexual activity until you have been notified of all your results and have undergone any necessary treatment.  If you are positive, it is recommended that you inform all sexual partners so they can treat be treated as well before having sex again.   Please follow up with your PCP within the next 2 weeks for recheck.

## 2024-09-01 NOTE — ED Notes (Signed)
 Chaperone visit with Ashlee Hermanns PA.

## 2024-09-01 NOTE — ED Provider Notes (Signed)
 [facility]  Note:  This document was prepared using Dragon voice recognition software and may include unintentional dictation errors.  MRN: [id number] DOB: 16-Jan-1971 DATE: 09/01/24   Subjective:  Chief Complaint: No chief complaint on file.    HPI: Kent Scott is a 53 y.o. male presenting for penile pain for about one month. Patient states he has a history of prostatitis and BPH. He reports that he was once hospitalized with sepsis secondary to prostatitis. He is concerned that his symptoms maybe reoccurring. He reports recent travel to [place], [place], and [place] again over the past month. He states he often holds his urine during this time by accident. He reports pain/discomfort along the shaft of his penis. He reports no pain with urination, but reports some relief when he does urinate. Reports no known STD exposures, but would like testing. Denies fever, vomiting/nausea, abdominal pain, dysuria, hematuria, penile discharge, penile lesions, rectal pain, rectal bleeding. Endorses penile pain. Presents NAD.  Prior to Admission medications   Medication Sig Start Date End Date Taking? Authorizing Provider  Blood Pressure Monitoring (OMRON 3 SERIES BP MONITOR) DEVI USE AS DIRECTED 09/20/20   [provider]  famotidine  (PEPCID ) 40 MG tablet Take 1 tablet (40 mg total) by mouth daily. 11/04/22   Curtis Debby PARAS, MD  Hormone Cream Base (HRT BASE) CREA  07/21/20   [provider]  ipratropium (ATROVENT ) 0.03 % nasal spray Place 2 sprays into both nostrils every 12 (twelve) hours. 07/04/21   Willo Mini, NP  tadalafil  (CIALIS ) 5 MG tablet Take 1 tablet (5 mg total) by mouth daily as needed for erectile dysfunction. 11/04/22   Curtis Debby PARAS, MD  testosterone  cypionate (DEPOTESTOSTERONE CYPIONATE) 200 MG/ML injection Inject 1 mL (200 mg total) into the muscle every 14 (fourteen) days. 11/04/22   Curtis Debby PARAS, MD  topiramate  (TOPAMAX ) 50 MG tablet  One half tab at night for a week then 1 tab at night 11/04/22   Curtis Debby PARAS, MD     Allergies  Allergen Reactions   Iodine Nausea And Vomiting   Shellfish Allergy Nausea And Vomiting   Pollen Extract     seasonal    History:   Past Medical History:  Diagnosis Date   Migraine      Past Surgical History:  Procedure Laterality Date   stomach band     VASECTOMY      Family History  Problem Relation Age of Onset   COPD Father     Social History   Tobacco Use   Smoking status: Never   Smokeless tobacco: Never  Substance Use Topics   Alcohol use: Yes    Comment: occasional   Drug use: Not Currently    Review of Systems  Constitutional:  Negative for fever.  Gastrointestinal:  Negative for abdominal pain, blood in stool, nausea, rectal pain and vomiting.  Genitourinary:  Positive for penile pain. Negative for dysuria, flank pain, genital sores, hematuria, penile discharge and testicular pain.  Musculoskeletal:  Negative for back pain.     Objective:   Vitals: BP (!) 153/92 (BP Location: Right Arm)   Pulse 86   Temp 98.5 F (36.9 C)   Resp 16   SpO2 96%   Physical Exam Exam conducted with a chaperone present Isidoro Knee, RN).  Constitutional:      General: He is not in acute distress.    Appearance: Normal appearance. He is well-developed and overweight. He is not ill-appearing or toxic-appearing.  HENT:  Head: Normocephalic and atraumatic.  Cardiovascular:     Rate and Rhythm: Normal rate and regular rhythm.     Heart sounds: Normal heart sounds.  Pulmonary:     Effort: Pulmonary effort is normal.     Breath sounds: Normal breath sounds.     Comments: Clear to auscultation bilaterally  Abdominal:     General: Bowel sounds are normal.     Palpations: Abdomen is soft.     Tenderness: There is no abdominal tenderness. There is no right CVA tenderness or left CVA tenderness.     Comments: No acute abdomen   Genitourinary:    Prostate:  Enlarged and tender.     Rectum: Normal.     Comments: TTP of prostate. Patient reports discomfort with palpation.  Skin:    General: Skin is warm and dry.  Neurological:     General: No focal deficit present.     Mental Status: He is alert.  Psychiatric:        Mood and Affect: Mood and affect normal.     Results:  Labs: No results found for this or any previous visit (from the past 24 hours).  Radiology: No results found.   UC Course/Treatments:  Procedures: Procedures   Medications Ordered in UC: Medications - No data to display   Assessment and Plan :     ICD-10-CM   1. Acute prostatitis  N41.0 POCT URINE DIPSTICK    Cytology Ancillary Only -Urethral; GC / Chlamydia, Trichomonas    POCT URINE DIPSTICK    Cytology Ancillary Only -Urethral; GC / Chlamydia, Trichomonas    Urine Culture    Urine Culture    2. Penile pain  N48.89 POCT URINE DIPSTICK    Cytology Ancillary Only -Urethral; GC / Chlamydia, Trichomonas    POCT URINE DIPSTICK    Cytology Ancillary Only -Urethral; GC / Chlamydia, Trichomonas    Urine Culture    Urine Culture     Acute Prostatitis Penile Pain Afebrile, nontoxic-appearing, NAD. VSS. DDX includes but not limited to: Cystitis, pyelonephritis, nephrolithiasis, STD, prostatitis, HSV UA was unremarkable. Urine culture is pending. Cytology pending as well. TTP of prostatitis as well as enlargement. Patient reports tenderness as well during exam. Given history of sepsis secondary to prostatitis, Levofloxacin  500mg  every day was prescribed for two weeks. Recommend he follow up with PCP for recheck prior to upcoming travel. Strict ED precautions were given and patient verbalized understanding.  ED Discharge Orders          Ordered    levofloxacin  (LEVAQUIN ) 500 MG tablet  Daily        09/01/24 1815             PDMP not reviewed this encounter.     Basilia Ulanda SQUIBB, PA-C 09/01/24 1826

## 2024-09-01 NOTE — ED Triage Notes (Signed)
 Patient states he is concerned he may have prostatitis again. C/O discomfort in the urethra. Patient states he wants to be checked for STD's as well.

## 2024-09-02 LAB — CYTOLOGY, (ORAL, ANAL, URETHRAL) ANCILLARY ONLY
Chlamydia: NEGATIVE
Comment: NEGATIVE
Comment: NEGATIVE
Comment: NORMAL
Neisseria Gonorrhea: NEGATIVE
Trichomonas: NEGATIVE

## 2024-09-02 LAB — URINE CULTURE: Culture: NO GROWTH

## 2024-10-26 ENCOUNTER — Ambulatory Visit
Admission: EM | Admit: 2024-10-26 | Discharge: 2024-10-26 | Disposition: A | Discharge status: Home / Self Care | Attending: Emergency Medicine | Admitting: Emergency Medicine

## 2024-10-26 DIAGNOSIS — L308 Other specified dermatitis: Secondary | ICD-10-CM | POA: Insufficient documentation

## 2024-10-26 DIAGNOSIS — N400 Enlarged prostate without lower urinary tract symptoms: Secondary | ICD-10-CM | POA: Diagnosis not present

## 2024-10-26 DIAGNOSIS — Z789 Other specified health status: Secondary | ICD-10-CM | POA: Insufficient documentation

## 2024-10-26 DIAGNOSIS — R300 Dysuria: Secondary | ICD-10-CM | POA: Diagnosis not present

## 2024-10-26 DIAGNOSIS — Z209 Contact with and (suspected) exposure to unspecified communicable disease: Secondary | ICD-10-CM | POA: Insufficient documentation

## 2024-10-26 DIAGNOSIS — G43009 Migraine without aura, not intractable, without status migrainosus: Secondary | ICD-10-CM | POA: Insufficient documentation

## 2024-10-26 HISTORY — DX: Benign prostatic hyperplasia without lower urinary tract symptoms: N40.0

## 2024-10-26 HISTORY — DX: Gastro-esophageal reflux disease without esophagitis: K21.9

## 2024-10-26 LAB — POCT URINE DIPSTICK
Bilirubin, UA: NEGATIVE
Glucose, UA: NEGATIVE mg/dL
Ketones, POC UA: NEGATIVE mg/dL
Leukocytes, UA: NEGATIVE
Nitrite, UA: NEGATIVE
POC PROTEIN,UA: NEGATIVE
Spec Grav, UA: 1.02 (ref 1.010–1.025)
Urobilinogen, UA: 0.2 U/dL
pH, UA: 7.5 (ref 5.0–8.0)

## 2024-10-26 MED ORDER — METRONIDAZOLE 500 MG PO TABS: 500.0000 mg | ORAL_TABLET | Freq: Two times a day (BID) | ORAL | 0 refills | Status: AC

## 2024-10-26 MED ORDER — LEVOFLOXACIN 500 MG PO TABS: 500.0000 mg | ORAL_TABLET | Freq: Every day | ORAL | 0 refills | Status: AC

## 2024-10-26 MED ORDER — AZITHROMYCIN 250 MG PO TABS: ORAL_TABLET | ORAL | 1 refills | Status: DC

## 2024-10-26 MED ORDER — CLINDAMYCIN PHOSPHATE 2 % VA CREA: TOPICAL_CREAM | VAGINAL | 0 refills | Status: DC

## 2024-10-26 NOTE — Discharge Instructions (Addendum)
 The results of your STD testing today which screens for gonorrhea, chlamydia, and trichomonas will be posted to your MyChart account once it is complete.  This typically takes 2 to 4 days.  Please abstain from sexual intercourse of any kind, vaginal, oral or anal, until you have received the results of your STD testing.      If any of your results are abnormal, you will receive a phone call regarding treatment.  Prescriptions, if any are needed, will be provided for you at your pharmacy.      Your urinalysis today did reveal a trace amount of blood but did not show any obvious signs of infection otherwise.  I suspect the trace amount of blood is related to the discomfort you are feeling in your penile shaft, possibly due to inflammation.  As we discussed, we will perform a urine culture to completely rule out possibility of urinary tract infection as a cause of your symptoms.  If the urine culture is positive and the bacteria found is not susceptible to levofloxacin , he will be contacted by phone and a new prescription will be provided for you.  To rule out BV, I recommend that you consider taking a 7-day course of metronidazole and using topical clindamycin for partner treatment of BV.  You are welcome to start this now or wait until your wife has been experiencing symptoms herself.  Ideally, you would want to both be treated at the same time.  I sent a prescription for metronidazole and clindamycin gel to your pharmacy.  The prescriptions are good for 1 year.   Here is information about partner treatment that is made available through an online reference we use called UpToDate:  Male partners - For most monogamous couples, we suggest simultaneously treating the male sex partner with dual antibiotic treatment, if feasible (Grade 2C). This suggestion assumes that the male has confirmed BV; the male partner is invested and likely to complete the seven-day treatment; and the couple is willing to either  abstain from sex or use condoms, until both partners have completed treatment.  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  It is very important that you see a urologist as soon as you can.  The urologist that you were referred to by your PCP is:  [facility]  [address]  [place], [place] [address]  Phone: [phone]   The referral was placed 11 days ago so if you have not heard from them, perhaps you could give them a call today to see how soon they can schedule an appointment for you.  Until you are seen by your urologist, I think it is reasonable to continue taking levofloxacin  500 mg daily for another 14 days, particularly since it did provide you with relief of your symptoms.  I provided you with a prescription for this as well.  For your eczema, please consider using an antifungal shampoo such as Head and Shoulders, Selsun Blue or Neutrogena T-Gel once or twice a week as a body wash.  This will reduce your topical fungal  burden and may calm your skin better than use of topical steroids alone.  As a courtesy, I renewed your prescription for azithromycin  for prevention and/or treatment of traveler's diarrhea, since you travel so often.  It has been a pleasure speaking with you today.  We appreciate the opportunity to participate in your care.  Please let us  know if there is anything else we can do for you.

## 2024-10-26 NOTE — ED Triage Notes (Signed)
 He states at his doctor visit they said to call us  to see if we could re-prescribe or extend the medication we had previously given him.   He was recently out of the country and he states that flying can often be a trigger for him. He states he will often not use the restroom on the flights.   He said he does have a call from the urologist office but he is unsure of when he would be able to get in.   He states while on the medication his symptoms had decreased. He states 4-5 days after coming off the medication he was still having discomfort in his urethra. The discomfort has been less than the initial time her came in. He states he has also tried Saw Palmetto which has also helped.

## 2024-10-26 NOTE — ED Provider Notes (Signed)
 Kent Scott    CSN: [id number] Arrival date & time: 10/26/24  9147    HISTORY   Chief Complaint  Patient presents with   Penile Discomfort   HPI Kent Scott is a pleasant, 53 y.o. male who presents to urgent care today. Patient complains of penile pain that he describes as a stabbing pain that is intermittent and not related to urinating or ejaculation.  Patient states he has noticed that if he drinks cranberry juice or beer, frequent urination improves this pain.  Patient states he has also tried taking Saw Palmetto which has also helped.  Patient states he was seen at this location a few months ago and was prescribed levofloxacin  for 14 days.  States that this decreased his symptoms as well but 4-5 after he completed the prescription, symptoms returned in full.  Patient states his doctor has referred him to see a urologist but because he frequently travels out of the country, often for several weeks at a time, he has not been able to get this appointment scheduled.  Patient states that while traveling, he avoids using public restrooms and tends to habitually retain urine.  Patient denies avoiding bowel movements, states his bowels move regularly and does not have any abdominal pain at this time.  Patient states he did reach out to his PCP to see if they would be willing to refill the levofloxacin  for him, states they advised him to contact us  for this instead.  Patient states he is married and denies known STD exposure or concern for STD exposure.  EMR reviewed by me, during visit here on September 01, 2024 at this urgent care location with a different provider, STD testing and urinalysis were performed and the results were negative, urine culture was deferred.  Physical examination revealed an enlarged and tender prostate.  Upon further discussion, patient states that his wife has frequent episodes of BV, states he has never been treated for BV at the same time she has despite  asking if he needed to be treated.  Patient denies abnormal penile discharge, testicular pain or swelling, scrotal pain or swelling, urinary hesitancy, urinary frequency, sensation of incomplete emptying, back pain, abdominal pain, genital lesion, flank pain, personal or family history of kidney stones, gout.  Patient endorses a history of eczema on his scalp (which is shaved) and upper chest, not currently using any treatment for this.  Patient reports some job-related stress and anxiety, denies excessive alcohol use at this time.  The history is provided by the patient.   Past Medical History:  Diagnosis Date   BPH (benign prostatic hyperplasia)    GERD (gastroesophageal reflux disease)    Migraine    Patient Active Problem List   Diagnosis Date Noted   Migraine without aura and without status migrainosus, not intractable 10/26/2024   Obstructive sleep apnea syndrome 06/14/2024   Excessive daytime sleepiness 03/10/2023   GERD (gastroesophageal reflux disease) 11/04/2022   Migraine headache 03/07/2022   Work stress 03/07/2022   Mixed hyperlipidemia 01/06/2021   Atypical chest pain 01/04/2021   Male hypogonadism 10/31/2020   BPH (benign prostatic hyperplasia) 01/10/2020   Prediabetes 01/10/2020   Annual physical exam 12/13/2019   History of sepsis with prostatitis 12/13/2019   Dysuria 12/13/2019   Prostatitis 04/16/2019   Past Surgical History:  Procedure Laterality Date   FINGER FRACTURE SURGERY     stomach band     VASECTOMY      Home Medications    Prior to  Admission medications   Medication Sig Start Date End Date Taking? Authorizing Provider  clindamycin (CLEOCIN) 2 % vaginal cream Apply to skin of penis twice daily for 7 days. 10/26/24  Yes Joesph Shaver Scales, PA-C  famotidine  (PEPCID ) 40 MG tablet Take 1 tablet (40 mg total) by mouth daily. 11/04/22  Yes Thekkekandam, Thomas J, MD  ipratropium (ATROVENT ) 0.03 % nasal spray Place 2 sprays into both nostrils every 12  (twelve) hours. Patient taking differently: Place 2 sprays into both nostrils daily as needed. 07/04/21  Yes Willo Mini, NP  levofloxacin  (LEVAQUIN ) 500 MG tablet Take 1 tablet (500 mg total) by mouth daily for 14 days. 10/26/24 11/09/24 Yes Joesph Shaver Scales, PA-C  loratadine (CLARITIN) 10 MG tablet Take 10 mg by mouth. Patient taking differently: Take 10 mg by mouth daily as needed. 09/16/24  Yes [provider]  metroNIDAZOLE (FLAGYL) 500 MG tablet Take 1 tablet (500 mg total) by mouth 2 (two) times daily for 7 days. 10/26/24 11/02/24 Yes Joesph Shaver Scales, PA-C  Multiple Vitamin (MULTIVITAMIN PO) Take by mouth.   Yes [provider]  NURTEC 75 MG TBDP Take 75 mg by mouth. Patient taking differently: Take 75 mg by mouth daily as needed. 07/20/24  Yes [provider]  Saw Palmetto, Serenoa repens, (SAW PALMETTO PO) Take by mouth.   Yes [provider]  sodium chloride (OCEAN) 0.65 % nasal spray 1 spray. Patient taking differently: 1 spray daily as needed. 09/16/24 09/16/25 Yes [provider]  tadalafil  (CIALIS ) 5 MG tablet Take 1 tablet (5 mg total) by mouth daily as needed for erectile dysfunction. 11/04/22  Yes Curtis Debby PARAS, MD  topiramate  (TOPAMAX ) 50 MG tablet One half tab at night for a week then 1 tab at night Patient taking differently: daily as needed. One half tab at night for a week then 1 tab at night 11/04/22  Yes Thekkekandam, Debby PARAS, MD  azithromycin  (ZITHROMAX ) 250 MG tablet Take 2 tablets at one time with one imodium in case of traveler's diarrhea. Kent repeat one time if diarrhea persists after 4-6 hours 10/26/24 10/16/26  Joesph Shaver Scales, PA-C    Family History Family History  Problem Relation Age of Onset   Bipolar disorder Mother    Schizophrenia Mother    COPD Father    Alcohol abuse Father    Hypercholesterolemia Brother    Mental illness Brother    Social History Social History   Tobacco Use    Smoking status: Never    Passive exposure: Past   Smokeless tobacco: Never  Vaping Use   Vaping status: Never Used  Substance Use Topics   Alcohol use: Yes    Comment: occasional   Drug use: Not Currently   Allergies   Iodine, Shellfish allergy, and Pollen extract  Review of Systems Review of Systems Pertinent findings revealed after performing a 14 point review of systems has been noted in the history of present illness.  Physical Exam Vital Signs BP (!) 145/98 (BP Location: Right Arm)   Pulse 61   Temp 98.2 F (36.8 C) (Oral)   Resp 18   SpO2 97%   No data found.  Physical Exam Vitals and nursing note reviewed.  Constitutional:      General: He is awake. He is not in acute distress.    Appearance: Normal appearance. He is well-developed and well-groomed. He is not ill-appearing.  HENT:     Head:   Chest:    Genitourinary:  Comments: Patient politely declined GU exam Neurological:     Mental Status: He is alert.  Psychiatric:        Behavior: Behavior is cooperative.     Visual Acuity Right Eye Distance:   Left Eye Distance:   Bilateral Distance:    Right Eye Near:   Left Eye Near:    Bilateral Near:     [name] / Diagnostics / Procedures:     Radiology No results found.  Procedures Procedures (including critical care time) EKG  Pending results:  Labs Reviewed  POCT URINE DIPSTICK - Abnormal; Notable for the following components:      Result Value   Blood, UA trace-intact (*)    All other components within normal limits  URINE CULTURE  CYTOLOGY, (ORAL, ANAL, URETHRAL) ANCILLARY ONLY    Medications Ordered in Scott: Medications - No data to display  Scott Diagnoses / Final Clinical Impressions(s)   I have reviewed the triage vital signs and the nursing notes.  Pertinent labs & imaging results that were available during my care of the patient were reviewed by me and considered in my medical decision making (see chart for details).     Final diagnoses:  Dysuria  Benign prostatic hyperplasia without lower urinary tract symptoms  Contact w and exposure to unsp communicable disease  Other eczema  History of international travel   Urinalysis revealed trace blood, will perform urine culture and treat as needed based on results of urine culture.  Discussed partner treatment for BV, patient provided with a prescription for metronidazole and clindamycin gel for use should his wife require treatment at some point.  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  Have renewed levofloxacin  500 mg daily for 2 more weeks given that this provided him with short-term relief.  Patient encouraged to be sure to schedule an appointment with his urologist to soon as possible for further evaluation and treatment.  Discussed use of antifungal shampoo to help ameliorate eczema symptoms.  As a courtesy, patient provided with a refill of azithromycin  for traveler's diarrhea as his refills have run out.  Please see discharge instructions below for details of plan of care as provided to patient. ED Prescriptions     Medication Sig Dispense Auth. Provider   levofloxacin  (LEVAQUIN ) 500 MG tablet Take 1 tablet (500 mg total) by mouth daily for 14 days. 14 tablet Joesph Shaver Scales, PA-C   metroNIDAZOLE (FLAGYL) 500 MG tablet Take 1 tablet (500 mg total) by mouth 2 (two) times daily for 7 days. 14 tablet Joesph Shaver Scales, PA-C   azithromycin  (ZITHROMAX ) 250 MG tablet Take 2 tablets at one time with one imodium in case of traveler's diarrhea. Kent repeat one time if diarrhea persists after 4-6 hours 6 each Joesph Shaver Scales, PA-C   clindamycin (CLEOCIN) 2 % vaginal cream Apply to skin of penis twice daily for 7 days. 40 g Joesph Shaver Scales, PA-C      PDMP not reviewed this encounter.    Discharge Instructions      The results of your STD testing today which screens for gonorrhea, chlamydia, and  trichomonas will be posted to your MyChart account once it is complete.  This typically takes 2 to 4 days.  Please abstain from sexual intercourse of any kind, vaginal, oral or anal, until you have received the results of your STD testing.      If any of your results are abnormal, you will receive a phone  call regarding treatment.  Prescriptions, if any are needed, will be provided for you at your pharmacy.      Your urinalysis today did reveal a trace amount of blood but did not show any obvious signs of infection otherwise.  I suspect the trace amount of blood is related to the discomfort you are feeling in your penile shaft, possibly due to inflammation.  As we discussed, we will perform a urine culture to completely rule out possibility of urinary tract infection as a cause of your symptoms.  If the urine culture is positive and the bacteria found is not susceptible to levofloxacin , he will be contacted by phone and a new prescription will be provided for you.  To rule out BV, I recommend that you consider taking a 7-day course of metronidazole and using topical clindamycin for partner treatment of BV.  You are welcome to start this now or wait until your wife has been experiencing symptoms herself.  Ideally, you would want to both be treated at the same time.  I sent a prescription for metronidazole and clindamycin gel to your pharmacy.  The prescriptions are good for 1 year.   Here is information about partner treatment that is made available through an online reference we use called UpToDate:  Male partners - For most monogamous couples, we suggest simultaneously treating the male sex partner with dual antibiotic treatment, if feasible (Grade 2C). This suggestion assumes that the male has confirmed BV; the male partner is invested and likely to complete the seven-day treatment; and the couple is willing to either abstain from sex or use condoms, until both partners have completed treatment.   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  It is very important that you see a urologist as soon as you can.  The urologist that you were referred to by your PCP is:  [facility]  [address], [address]  [place], [place] [address]  Phone: [phone]   The referral was placed 11 days ago so if you have not heard from them, perhaps you could give them a call today to see how soon they can schedule an appointment for you.  Until you are seen by your urologist, I think it is reasonable to continue taking levofloxacin  500 mg daily for another 14 days, particularly since it did provide you with relief of your symptoms.  I provided you with a prescription for this as well.  For your eczema, please consider using an antifungal shampoo such as Head and Shoulders, Selsun Blue or Neutrogena T-Gel once or twice a week as a body wash.  This will reduce your topical fungal burden and Kent calm your skin better than use of topical steroids alone.  As a courtesy, I renewed your prescription for azithromycin  for prevention and/or treatment of traveler's diarrhea, since you travel so often.  It has been a pleasure speaking with you today.  We appreciate the opportunity to participate in your care.  Please let us  know if there is anything else we can do for you.  Disposition Upon Discharge:  Condition: stable for discharge home  Patient presented with an acute illness with associated systemic symptoms and significant discomfort requiring urgent management.  In my opinion, this is a condition that a prudent lay person (someone who possesses an average knowledge of health and medicine) Kent potentially expect to result in complications if not addressed urgently such as respiratory distress, impairment of bodily function or dysfunction of bodily organs.   Routine symptom specific, illness specific and/or disease specific instructions were discussed with the patient and/or caregiver at length.   As such, the patient has been evaluated and assessed, work-up was performed and treatment was provided in alignment with urgent care protocols and evidence based medicine.  Patient/parent/caregiver has been advised that the patient Kent require follow up for further testing and treatment if the symptoms continue in spite of treatment, as clinically indicated and appropriate.  Patient/parent/caregiver has been advised to return to the [facility] or PCP if no better; to PCP or the Emergency Department if new signs and symptoms develop, or if the current signs or symptoms continue to change or worsen for further workup, evaluation and treatment as clinically indicated and appropriate  The patient will follow up with their current PCP if and as advised. If the patient does not currently have a PCP we will assist them in obtaining one.   The patient Kent need specialty follow up if the symptoms continue, in spite of conservative treatment and management, for further workup, evaluation, consultation and treatment as clinically indicated and appropriate.  Patient/parent/caregiver verbalized understanding and agreement of plan as discussed.  All questions were addressed during visit.  Please see discharge instructions below for further details of plan.  This office note has been dictated using Teaching laboratory technician.  Unfortunately, this method of dictation can sometimes lead to typographical or grammatical errors.  I apologize for your inconvenience in advance if this  occurs.  Please do not hesitate to reach out to me if clarification is needed.      Joesph Shaver Scales, PA-C 10/26/24 1047

## 2024-10-27 LAB — CYTOLOGY, (ORAL, ANAL, URETHRAL) ANCILLARY ONLY
Chlamydia: NEGATIVE
Comment: NEGATIVE
Comment: NEGATIVE
Comment: NORMAL
Neisseria Gonorrhea: NEGATIVE
Trichomonas: NEGATIVE

## 2024-10-28 ENCOUNTER — Ambulatory Visit (HOSPITAL_COMMUNITY): Payer: Self-pay

## 2024-10-28 LAB — URINE CULTURE: Culture: <10000 — AB

## 2024-12-12 ENCOUNTER — Ambulatory Visit
Admission: EM | Admit: 2024-12-12 | Discharge: 2024-12-12 | Disposition: A | Discharge status: Home / Self Care | Attending: Emergency Medicine | Admitting: Emergency Medicine

## 2024-12-12 DIAGNOSIS — Z87438 Personal history of other diseases of male genital organs: Secondary | ICD-10-CM | POA: Insufficient documentation

## 2024-12-12 DIAGNOSIS — R300 Dysuria: Secondary | ICD-10-CM | POA: Insufficient documentation

## 2024-12-12 DIAGNOSIS — R350 Frequency of micturition: Secondary | ICD-10-CM | POA: Insufficient documentation

## 2024-12-12 LAB — POCT URINE DIPSTICK
Bilirubin, UA: NEGATIVE
Blood, UA: NEGATIVE
Glucose, UA: NEGATIVE mg/dL
Ketones, POC UA: NEGATIVE mg/dL
Leukocytes, UA: NEGATIVE
Nitrite, UA: NEGATIVE
Protein Ur, POC: 30 mg/dL — AB
Spec Grav, UA: 1.025
Urobilinogen, UA: 0.2 U/dL
pH, UA: 6.5

## 2024-12-12 MED ORDER — SULFAMETHOXAZOLE-TRIMETHOPRIM 800-160 MG PO TABS: 2.0000 | ORAL_TABLET | Freq: Two times a day (BID) | ORAL | 0 refills | Status: AC

## 2024-12-12 NOTE — ED Triage Notes (Addendum)
 Patient reports hx of recurrent UTI and states he has been having painful urination and and increase in urinary frequency .  Started: this past week  Home interventions: hydration, cranberry juice, azo (last taken last night)

## 2024-12-12 NOTE — ED Provider Notes (Signed)
 "    Kent Kent    CSN: [id number] Arrival date & time: 12/12/24  0857    HISTORY   Chief Complaint  Patient presents with   Urinary Frequency   HPI Kent Kent is a pleasant, 54 y.o. male who presents to urgent care today. Patient complains of recurrent UTI.  Per EMR, patient has a history of chronic prostatitis and frequent urinary tract infections secondary to this.. Patient endorses burning with urination and increased frequency of urination.   Patient denies penile discharge, testicular pain or swelling, scrotal pain or swelling, rectal pain, suprapubic pain, blood in urine, sensation of incomplete emptying, and concern for STD exposure.   The history is provided by the patient.  Urinary Frequency    Past Medical History:  Diagnosis Date   BPH (benign prostatic hyperplasia)    GERD (gastroesophageal reflux disease)    Migraine    Patient Active Problem List   Diagnosis Date Noted   Migraine without aura and without status migrainosus, not intractable 10/26/2024   Obstructive sleep apnea syndrome 06/14/2024   Excessive daytime sleepiness 03/10/2023   GERD (gastroesophageal reflux disease) 11/04/2022   Migraine headache 03/07/2022   Work stress 03/07/2022   Mixed hyperlipidemia 01/06/2021   Atypical chest pain 01/04/2021   Male hypogonadism 10/31/2020   BPH (benign prostatic hyperplasia) 01/10/2020   Prediabetes 01/10/2020   Annual physical exam 12/13/2019   History of sepsis with prostatitis 12/13/2019   Dysuria 12/13/2019   Prostatitis 04/16/2019   Past Surgical History:  Procedure Laterality Date   FINGER FRACTURE SURGERY     stomach band     VASECTOMY      Home Medications    Prior to Admission medications  Medication Sig Start Date End Date Taking? Authorizing Provider  sulfamethoxazole -trimethoprim  (BACTRIM  DS) 800-160 MG tablet Take 2 tablets by mouth 2 (two) times daily for 14 days. 12/12/24 12/26/24 Yes Joesph Shaver Scales, PA-C   famotidine  (PEPCID ) 40 MG tablet Take 1 tablet (40 mg total) by mouth daily. 11/04/22   Thekkekandam, Thomas J, MD  ipratropium (ATROVENT ) 0.03 % nasal spray Place 2 sprays into both nostrils every 12 (twelve) hours. Patient taking differently: Place 2 sprays into both nostrils daily as needed. 07/04/21   Jessup, Joy, NP  loratadine (CLARITIN) 10 MG tablet Take 10 mg by mouth. Patient taking differently: Take 10 mg by mouth daily as needed. 09/16/24   [provider]  Multiple Vitamin (MULTIVITAMIN PO) Take by mouth.    [provider]  Saw Palmetto, Serenoa repens, (SAW PALMETTO PO) Take by mouth.    [provider]  sodium chloride (OCEAN) 0.65 % nasal spray 1 spray. Patient taking differently: 1 spray daily as needed. 09/16/24 09/16/25  [provider]  tadalafil  (CIALIS ) 5 MG tablet Take 1 tablet (5 mg total) by mouth daily as needed for erectile dysfunction. 11/04/22   Curtis Debby PARAS, MD  topiramate  (TOPAMAX ) 50 MG tablet One half tab at night for a week then 1 tab at night Patient taking differently: daily as needed. One half tab at night for a week then 1 tab at night 11/04/22   Curtis Debby PARAS, MD    Family History Family History  Problem Relation Age of Onset   Bipolar disorder Mother    Schizophrenia Mother    COPD Father    Alcohol abuse Father    Hypercholesterolemia Brother    Mental illness Brother    Social History Social History[1] Allergies  Iodine, Shellfish allergy, and Pollen extract  Review of Systems Review of Systems  Genitourinary:  Positive for frequency.   Pertinent findings revealed after performing a 14 point review of systems has been noted in the history of present illness.  Physical Exam Vital Signs BP 124/84 (BP Location: Right Arm)   Pulse 64   Temp 97.7 F (36.5 C) (Oral)   Resp 18   SpO2 98%   No data found.  Physical Exam Vitals and nursing note reviewed.  Constitutional:       General: He is awake. He is not in acute distress.    Appearance: Normal appearance. He is not ill-appearing.  HENT:     Head: Normocephalic and atraumatic.  Eyes:     General: Lids are normal.        Right eye: No discharge.        Left eye: No discharge.     Conjunctiva/sclera: Conjunctivae normal.     Right eye: Right conjunctiva is not injected.     Left eye: Left conjunctiva is not injected.  Neck:     Trachea: Trachea and phonation normal.  Cardiovascular:     Rate and Rhythm: Normal rate and regular rhythm.  Pulmonary:     Effort: Pulmonary effort is normal.  Abdominal:     General: Abdomen is flat. Bowel sounds are normal.     Palpations: Abdomen is soft.  Genitourinary:    Comments: Pt politely declines GU exam Musculoskeletal:     Cervical back: Full passive range of motion without pain, normal range of motion and neck supple.  Lymphadenopathy:     Cervical: No cervical adenopathy.  Skin:    General: Skin is warm and dry.     Findings: No erythema or rash.  Neurological:     General: No focal deficit present.     Mental Status: He is alert and oriented to person, place, and time. Mental status is at baseline.  Psychiatric:        Attention and Perception: Attention and perception normal.        Mood and Affect: Mood normal.        Speech: Speech normal.        Behavior: Behavior normal. Behavior is cooperative.        Thought Content: Thought content normal.     Visual Acuity Right Eye Distance:   Left Eye Distance:   Bilateral Distance:    Right Eye Near:   Left Eye Near:    Bilateral Near:     Kent Kent / Diagnostics / Procedures:     Radiology No results found.  Procedures Procedures (including critical care time) EKG  Pending results:  Labs Reviewed  POCT URINE DIPSTICK - Abnormal; Notable for the following components:      Result Value   Protein Ur, POC =30 (*)    All other components within normal limits  URINE CULTURE    Medications  Ordered in Kent: Medications - No data to display  Kent Diagnoses / Final Clinical Impressions(s)   I have reviewed the triage vital signs and the nursing notes.  Pertinent labs & imaging results that were available during my care of the patient were reviewed by me and considered in my medical decision making (see chart for details).    Final diagnoses:  Dysuria  Increased frequency of urination  History of chronic prostatitis   Urinalysis today was unremarkable.  Given history of frequent urinary tract infections, will send urine  for culture anyway.  Patient will begin Bactrim  for empiric treatment of presumed urinary tract infection but will also manage a possible flareup of his chronic prostatitis.  Patient states he has already scheduled an appointment with his urologist for next week.  Conservative care recommended.  Return precautions advised.    Please see discharge instructions below for details of plan of care as provided to patient. ED Prescriptions     Medication Sig Dispense Auth. Provider   sulfamethoxazole -trimethoprim  (BACTRIM  DS) 800-160 MG tablet Take 2 tablets by mouth 2 (two) times daily for 14 days. 56 tablet Joesph Shaver Scales, PA-C      PDMP not reviewed this encounter.  Disposition Upon Discharge:  Condition: stable for discharge home  Patient presented with concern for an acute illness with associated systemic symptoms and significant discomfort requiring urgent management. In my opinion, this is a condition that a prudent lay person (someone who possesses an average knowledge of health and medicine) may potentially expect to result in complications if not addressed urgently such as respiratory distress, impairment of bodily function or dysfunction of bodily organs.   As such, the patient has been evaluated and assessed, work-up was performed and treatment was provided in alignment with urgent care protocols and evidence based medicine.  Patient/parent/caregiver  has been advised that the patient may require follow up for further testing and/or treatment if the symptoms continue in spite of treatment, as clinically indicated and appropriate.  Routine symptom specific, illness specific and/or disease specific instructions were discussed with the patient and/or caregiver at length.  Prevention strategies for avoiding STD exposure were also discussed.  The patient will follow up with their current PCP if and as advised. If the patient does not currently have a PCP we will assist them in obtaining one.   The patient may need specialty follow up if the symptoms continue, in spite of conservative treatment and management, for further workup, evaluation, consultation and treatment as clinically indicated and appropriate.  Patient/parent/caregiver verbalized understanding and agreement of plan as discussed.  All questions were addressed during visit.  Please see discharge instructions below for further details of plan.    Discharge Instructions      As we discussed, your urinalysis today did not reveal anything untoward.  We will perform a urine culture to confirm this.  I would still like for you to begin an antibiotic today, particularly since you are traveling to [place] day after tomorrow.  Because you have a history of prostatitis, I have chosen an antibiotic that will treat both prostatitis and an acute urinary tract infection.  It is also different than the 1 you had last time, levofloxacin .  I have sent a prescription for Bactrim  to your pharmacy.  Please take 2 tablets twice daily for the next 14 days or until you are advised otherwise either by the nurse who will contact you with the results of your urine culture or by your urologist.  Thank you once again for visiting [facility] today.  It was a pleasure to see you and I wish you a happy new year as well.    This office note has been dictated using Teaching laboratory technician.   Unfortunately, this method of dictation can sometimes lead to typographical or grammatical errors.  I apologize for your inconvenience in advance if this occurs.  Please do not hesitate to reach out to me if clarification is needed.        [1]  Social  History Tobacco Use   Smoking status: Never    Passive exposure: Past   Smokeless tobacco: Never  Vaping Use   Vaping status: Never Used  Substance Use Topics   Alcohol use: Yes    Comment: occasional   Drug use: Not Currently     Joesph Shaver Scales, PA-C 12/12/24 1233  "

## 2024-12-12 NOTE — Discharge Instructions (Signed)
 As we discussed, your urinalysis today did not reveal anything untoward.  We will perform a urine culture to confirm this.  I would still like for you to begin an antibiotic today, particularly since you are traveling to [place] day after tomorrow.  Because you have a history of prostatitis, I have chosen an antibiotic that will treat both prostatitis and an acute urinary tract infection.  It is also different than the 1 you had last time, levofloxacin .  I have sent a prescription for Bactrim  to your pharmacy.  Please take 2 tablets twice daily for the next 14 days or until you are advised otherwise either by the nurse who will contact you with the results of your urine culture or by your urologist.  Thank you once again for visiting [facility] today.  It was a pleasure to see you and I wish you a happy new year as well.

## 2024-12-13 LAB — URINE CULTURE: Culture: 40000 — AB

## 2024-12-14 ENCOUNTER — Ambulatory Visit (HOSPITAL_COMMUNITY): Payer: Self-pay

## 2024-12-15 NOTE — Progress Notes (Signed)
 Because of the low CFU count, I recommend that he finish bactrim  as prescribed. If he is not feeling any better after completing Bactrim , please ask him to return for repeat urine culture.  Thank you much.
